# Patient Record
Sex: Male | Born: 1960 | Race: Black or African American | Hispanic: No | Smoking: Current every day smoker
Health system: Southern US, Community
[De-identification: ages and names within clinical notes are randomized; demographics above are authoritative.]

## PROBLEM LIST (undated history)

## (undated) DIAGNOSIS — K219 Gastro-esophageal reflux disease without esophagitis: Secondary | ICD-10-CM

## (undated) DIAGNOSIS — N50812 Left testicular pain: Secondary | ICD-10-CM

## (undated) DIAGNOSIS — R972 Elevated prostate specific antigen [PSA]: Secondary | ICD-10-CM

## (undated) DIAGNOSIS — Z720 Tobacco use: Secondary | ICD-10-CM

## (undated) DIAGNOSIS — Z87448 Personal history of other diseases of urinary system: Secondary | ICD-10-CM

## (undated) DIAGNOSIS — N400 Enlarged prostate without lower urinary tract symptoms: Secondary | ICD-10-CM

## (undated) DIAGNOSIS — R3129 Other microscopic hematuria: Secondary | ICD-10-CM

## (undated) DIAGNOSIS — Z87442 Personal history of urinary calculi: Secondary | ICD-10-CM

## (undated) DIAGNOSIS — N451 Epididymitis: Secondary | ICD-10-CM

## (undated) HISTORY — DX: Epididymitis: N45.1

## (undated) HISTORY — PX: COLONOSCOPY: SHX174

## (undated) HISTORY — DX: Other microscopic hematuria: R31.29

## (undated) HISTORY — DX: Tobacco use: Z72.0

## (undated) HISTORY — DX: Left testicular pain: N50.812

## (undated) HISTORY — DX: Personal history of other diseases of urinary system: Z87.448

## (undated) HISTORY — DX: Benign prostatic hyperplasia without lower urinary tract symptoms: N40.0

## (undated) HISTORY — DX: Elevated prostate specific antigen (PSA): R97.20

## (undated) HISTORY — PX: NO PAST SURGERIES: SHX2092

---

## 2010-02-11 ENCOUNTER — Ambulatory Visit: Payer: Self-pay | Admitting: Family Medicine

## 2010-07-08 ENCOUNTER — Ambulatory Visit: Payer: Self-pay | Admitting: Family Medicine

## 2011-12-08 ENCOUNTER — Emergency Department: Payer: Self-pay | Admitting: Emergency Medicine

## 2013-08-06 ENCOUNTER — Emergency Department: Payer: Self-pay

## 2013-08-06 LAB — COMPREHENSIVE METABOLIC PANEL
ALK PHOS: 78 U/L
ALT: 24 U/L (ref 12–78)
AST: 17 U/L (ref 15–37)
Albumin: 3.3 g/dL — ABNORMAL LOW (ref 3.4–5.0)
Anion Gap: 8 (ref 7–16)
BUN: 14 mg/dL (ref 7–18)
Bilirubin,Total: 0.6 mg/dL (ref 0.2–1.0)
CREATININE: 0.88 mg/dL (ref 0.60–1.30)
Calcium, Total: 9.3 mg/dL (ref 8.5–10.1)
Chloride: 99 mmol/L (ref 98–107)
Co2: 27 mmol/L (ref 21–32)
EGFR (Non-African Amer.): >60
Glucose: 119 mg/dL — ABNORMAL HIGH (ref 65–99)
OSMOLALITY: 270 (ref 275–301)
POTASSIUM: 3.7 mmol/L (ref 3.5–5.1)
Sodium: 134 mmol/L — ABNORMAL LOW (ref 136–145)
Total Protein: 7.5 g/dL (ref 6.4–8.2)

## 2013-08-06 LAB — URINALYSIS, COMPLETE
BILIRUBIN, UR: NEGATIVE
Nitrite: NEGATIVE
PH: 5 (ref 4.5–8.0)
Protein: 100
RBC,UR: 8 /HPF (ref 0–5)
SPECIFIC GRAVITY: 1.029 (ref 1.003–1.030)

## 2013-08-06 LAB — CBC WITH DIFFERENTIAL/PLATELET
BASOS PCT: 0.1 %
Basophil #: 0 10*3/uL (ref 0.0–0.1)
Eosinophil #: 0 10*3/uL (ref 0.0–0.7)
Eosinophil %: 0 %
HCT: 41.8 % (ref 40.0–52.0)
HGB: 14 g/dL (ref 13.0–18.0)
LYMPHS PCT: 3.3 %
Lymphocyte #: 0.8 10*3/uL — ABNORMAL LOW (ref 1.0–3.6)
MCH: 30.1 pg (ref 26.0–34.0)
MCHC: 33.4 g/dL (ref 32.0–36.0)
MCV: 90 fL (ref 80–100)
MONOS PCT: 6.8 %
Monocyte #: 1.5 x10 3/mm — ABNORMAL HIGH (ref 0.2–1.0)
NEUTROS PCT: 89.8 %
Neutrophil #: 20.1 10*3/uL — ABNORMAL HIGH (ref 1.4–6.5)
Platelet: 203 10*3/uL (ref 150–440)
RBC: 4.64 10*6/uL (ref 4.40–5.90)
RDW: 14.1 % (ref 11.5–14.5)
WBC: 22.5 10*3/uL — AB (ref 3.8–10.6)

## 2013-08-06 LAB — GC/CHLAMYDIA PROBE AMP

## 2013-10-30 ENCOUNTER — Ambulatory Visit: Payer: Self-pay | Admitting: Gastroenterology

## 2014-10-01 ENCOUNTER — Encounter: Payer: Self-pay | Admitting: *Deleted

## 2014-10-02 ENCOUNTER — Ambulatory Visit: Payer: Self-pay | Admitting: Urology

## 2014-10-02 ENCOUNTER — Encounter: Payer: Self-pay | Admitting: *Deleted

## 2015-07-16 ENCOUNTER — Encounter: Payer: Self-pay | Admitting: Emergency Medicine

## 2015-07-16 ENCOUNTER — Emergency Department: Payer: 59

## 2015-07-16 ENCOUNTER — Emergency Department
Admission: EM | Admit: 2015-07-16 | Discharge: 2015-07-16 | Disposition: A | Discharge status: Home / Self Care | Payer: 59 | Attending: Emergency Medicine | Admitting: Emergency Medicine

## 2015-07-16 DIAGNOSIS — N50811 Right testicular pain: Secondary | ICD-10-CM | POA: Diagnosis present

## 2015-07-16 DIAGNOSIS — F17200 Nicotine dependence, unspecified, uncomplicated: Secondary | ICD-10-CM | POA: Diagnosis not present

## 2015-07-16 DIAGNOSIS — N451 Epididymitis: Secondary | ICD-10-CM | POA: Diagnosis not present

## 2015-07-16 LAB — CBC
HCT: 41.6 % (ref 40.0–52.0)
Hemoglobin: 14 g/dL (ref 13.0–18.0)
MCH: 29.3 pg (ref 26.0–34.0)
MCHC: 33.6 g/dL (ref 32.0–36.0)
MCV: 87.3 fL (ref 80.0–100.0)
PLATELETS: 230 10*3/uL (ref 150–440)
RBC: 4.76 MIL/uL (ref 4.40–5.90)
RDW: 14.9 % — AB (ref 11.5–14.5)
WBC: 14.1 10*3/uL — AB (ref 3.8–10.6)

## 2015-07-16 LAB — BASIC METABOLIC PANEL
ANION GAP: 12 (ref 5–15)
BUN: 18 mg/dL (ref 6–20)
CALCIUM: 9.2 mg/dL (ref 8.9–10.3)
CO2: 25 mmol/L (ref 22–32)
CREATININE: 0.94 mg/dL (ref 0.61–1.24)
Chloride: 102 mmol/L (ref 101–111)
GFR calc Af Amer: >60 mL/min (ref >60)
GLUCOSE: 115 mg/dL — AB (ref 65–99)
Potassium: 4.1 mmol/L (ref 3.5–5.1)
Sodium: 139 mmol/L (ref 135–145)

## 2015-07-16 MED ORDER — OXYCODONE-ACETAMINOPHEN 7.5-325 MG PO TABS: 1.0000 | ORAL_TABLET | ORAL | Status: DC | PRN

## 2015-07-16 MED ORDER — LEVOFLOXACIN 750 MG PO TABS
750.0000 mg | ORAL_TABLET | Freq: Every day | ORAL | Status: AC
Start: 2015-07-16 — End: 2015-07-23

## 2015-07-16 MED ORDER — HYDROMORPHONE HCL 1 MG/ML IJ SOLN
0.5000 mg | Freq: Once | INTRAMUSCULAR | Status: AC
Start: 2015-07-16 — End: 2015-07-16
  Administered 2015-07-16: 0.5 mg via INTRAVENOUS
  Filled 2015-07-16: qty 1

## 2015-07-16 MED ORDER — IBUPROFEN 800 MG PO TABS: 800.0000 mg | ORAL_TABLET | Freq: Three times a day (TID) | ORAL | Status: DC | PRN

## 2015-07-16 NOTE — ED Notes (Signed)
Pt transported to ultrasound.

## 2015-07-16 NOTE — ED Provider Notes (Signed)
[facility] Emergency Department Provider Note  ____________________________________________  Time seen: Approximately 10:00 AM  I have reviewed the triage vital signs and the nursing notes.   HISTORY  Chief Complaint Groin Pain    HPI Joel Clay is a 55 y.o. male with a history of epididymitis presenting with right testicular and scrotal pain.The patient reports that he was driving home from [place] last night when he developed right-sided testicular pain. He denies any penile discharge, lesions, sexual activity. No fever, nausea vomiting or diarrhea. Patient reports "this is happening before but they never found anything wrong." Reviewing his to previous ultrasound images, the patient has had findings consistent with epididymitis, as well as varicocele in the past.   Past Medical History  Diagnosis Date  . Hematuria, microscopic   . Tobacco abuse   . Benign enlargement of prostate   . Elevated PSA   . Epididymitis   . Testicular pain, left     There are no active problems to display for this patient.   History reviewed. No pertinent past surgical history.  No current outpatient prescriptions on file.  Allergies Review of patient's allergies indicates no known allergies.  Family History  Problem Relation Age of Onset  . Renal Disease Maternal Grandmother     Social History Social History  Substance Use Topics  . Smoking status: Current Every Day Smoker  . Smokeless tobacco: None  . Alcohol Use: 0.0 oz/week    0 Standard drinks or equivalent per week     Comment: occasional    Review of Systems Constitutional: No fever/chills. Eyes: No visual changes. ENT: No sore throat. No congestion or rhinorrhea. Cardiovascular: Denies chest pain. Denies palpitations. Respiratory: Denies shortness of breath.  No cough. Gastrointestinal: No abdominal pain.  No nausea, no vomiting.  No diarrhea.  No constipation. Genitourinary: Negative  for dysuria.Positive for right testicular and scrotal pain. No penile discharge or lesions. Musculoskeletal: Negative for back pain. Skin: Negative for rash. Neurological: Negative for headaches. No focal numbness, tingling or weakness.   10-point ROS otherwise negative.  ____________________________________________   PHYSICAL EXAM:  VITAL SIGNS: ED Triage Vitals  Enc Vitals Group     BP 07/16/15 0940 141/85 mmHg     Pulse Rate 07/16/15 0940 78     Resp 07/16/15 0940 18     Temp 07/16/15 0940 99 F (37.2 C)     Temp Source 07/16/15 0940 Oral     SpO2 07/16/15 0940 98 %     Weight 07/16/15 0940 165 lb (74.844 kg)     Height 07/16/15 0940 5\' 7"  (1.702 m)     Head Cir --      Peak Flow --      Pain Score 07/16/15 0940 10     Pain Loc --      Pain Edu? --      Excl. in [place]? --     Constitutional: Alert and oriented. Well appearing and in no acute distress. Answers questions appropriately. Eyes: Conjunctivae are normal.  EOMI. No scleral icterus. Head: Atraumatic. Nose: No congestion/rhinnorhea. Mouth/Throat: Mucous membranes are moist.  Neck: No stridor.  Supple.   Cardiovascular: Normal rate.  Respiratory: Normal respiratory effort.   Gastrointestinal: Soft, nontender and nondistended.  No guarding or rebound.  No peritoneal signs. Genitourinary: Normal-appearing penis without lesions or discharge. Left testicle has a normal lie and is without mass or pain. No left inguinal hernia. Right testicle is tender to palpation with some posterior swelling. The  patient does not tolerate my exam well, so I am unable to get a full palpation or an inguinal exam. There is no skin change over the scrotum, nor any erythema or warmth. Musculoskeletal: No LE edema. No ttp in the calves or palpable cords.  Negative Homan's sign. Neurologic:  A&Ox3.  Speech is clear.  Face and smile are symmetric.  EOMI.  Moves all extremities well. Skin:  Skin is warm, dry and intact. No rash  noted. Psychiatric: Mood and affect are normal. Speech and behavior are normal.  Normal judgement.  ____________________________________________   LABS (all labs ordered are listed, but only abnormal results are displayed)  Labs Reviewed  CBC - Abnormal; Notable for the following:    WBC 14.1 (*)    RDW 14.9 (*)    All other components within normal limits  BASIC METABOLIC PANEL - Abnormal; Notable for the following:    Glucose, Bld 115 (*)    All other components within normal limits  URINALYSIS COMPLETEWITH MICROSCOPIC ([facility] ONLY)   ____________________________________________  EKG  Not indicated  ____________________________________________  RADIOLOGY  US Scrotum  07/16/2015  CLINICAL DATA:  Right testicular pain. EXAM: SCROTAL ULTRASOUND DOPPLER ULTRASOUND OF THE TESTICLES TECHNIQUE: Complete ultrasound examination of the testicles, epididymis, and other scrotal structures was performed. Color and spectral Doppler ultrasound were also utilized to evaluate blood flow to the testicles. COMPARISON:  Ultrasound dated 08/06/2013 FINDINGS: Right testicle Measurements: 4.2 x 2.3 x 2.8 cm. No mass or microlithiasis visualized. Left testicle Measurements: 4.3 x 1.6 x 2.1 cm decreased in size from 4.6 x 2.5 x 3.0 cm 1 year ago. The testicle is now diffusely more hypoechoic than on the prior study and more hypoechoic than the right testicle to a greater degree than on the prior study. Right epididymis: The right epididymis is enlarged and heterogeneous and hypervascular consistent with acute right epididymitis, new since the prior study. Left epididymis: Normal. The epididymitis seen on the prior study has resolved. Hydrocele:  Small bilaterally. Varicocele:  Bilateral. Pulsed Doppler interrogation of both testes demonstrates normal low resistance arterial and venous waveforms bilaterally. IMPRESSION: 1. Acute right epididymitis. 2. The patient has developed atrophy and decreased perfusion of  the left testicle since the prior study consistent with prior orchitis, probably associated with the prior left epididymitis. Electronically Signed   By: Lorriane Shire M.D.   On: 07/16/2015 11:24   [place] Art/ven Flow Abd Pelv Doppler  07/16/2015  CLINICAL DATA:  Right testicular pain. EXAM: SCROTAL ULTRASOUND DOPPLER ULTRASOUND OF THE TESTICLES TECHNIQUE: Complete ultrasound examination of the testicles, epididymis, and other scrotal structures was performed. Color and spectral Doppler ultrasound were also utilized to evaluate blood flow to the testicles. COMPARISON:  Ultrasound dated 08/06/2013 FINDINGS: Right testicle Measurements: 4.2 x 2.3 x 2.8 cm. No mass or microlithiasis visualized. Left testicle Measurements: 4.3 x 1.6 x 2.1 cm decreased in size from 4.6 x 2.5 x 3.0 cm 1 year ago. The testicle is now diffusely more hypoechoic than on the prior study and more hypoechoic than the right testicle to a greater degree than on the prior study. Right epididymis: The right epididymis is enlarged and heterogeneous and hypervascular consistent with acute right epididymitis, new since the prior study. Left epididymis: Normal. The epididymitis seen on the prior study has resolved. Hydrocele:  Small bilaterally. Varicocele:  Bilateral. Pulsed Doppler interrogation of both testes demonstrates normal low resistance arterial and venous waveforms bilaterally. IMPRESSION: 1. Acute right epididymitis. 2. The patient has developed atrophy and  decreased perfusion of the left testicle since the prior study consistent with prior orchitis, probably associated with the prior left epididymitis. Electronically Signed   By: Lorriane Shire M.D.   On: 07/16/2015 11:24    ____________________________________________   PROCEDURES  Procedure(s) performed: None  Critical Care performed: No ____________________________________________   INITIAL IMPRESSION / ASSESSMENT AND PLAN / ED COURSE  Pertinent labs & imaging results that  were available during my care of the patient were reviewed by me and considered in my medical decision making (see chart for details).  55 y.o. male with right-sided scrotal and testicular pain since last night. We'll get an ultrasound to evaluate for varicocele, hydrocele, or torsion although this is less likely. He may also have epididymitis.  ----------------------------------------- 12:03 PM on 07/16/2015 -----------------------------------------  The patient has findings consistent with epididymitis on the right on his ultrasound. This clinically correlates as well. The patient denies any recent sexual activity over the past months, so we'll treat him for Escherichia coli, and a much less suspicious of gonorrhea or chlamydia. He will go home with a prescription for Levaquin, and instructions to follow up with urology. ____________________________________________  FINAL CLINICAL IMPRESSION(S) / ED DIAGNOSES  Final diagnoses:  Right testicular pain  Epididymitis, right      NEW MEDICATIONS STARTED DURING THIS VISIT:  New Prescriptions   No medications on file     Eula Listen, MD 07/16/15 1204

## 2015-07-16 NOTE — Discharge Instructions (Signed)
Please take the entire course of antibiotics, even if you are feeling better.  Please make a follow up appointment with your Urologist, or the Urologist from [facility].    For mild to moderate pain, you may ice your right testicle and take Motrin.  For severe pain, you may take Percocet.  Do not drive within 8 hours of taking Percocet.  Return to the emergency department for severe pain, fever, inability to keep down fluids, or any other symptoms concerning to you.  Epididymitis Epididymitis is swelling (inflammation) of the epididymis. The epididymis is a cord-like structure that is located along the top and back part of the testicle. It collects and stores sperm from the testicle. This condition can also cause pain and swelling of the testicle and scrotum. Symptoms usually start suddenly (acute epididymitis). Sometimes epididymitis starts gradually and lasts for a while (chronic epididymitis). This type may be harder to treat. CAUSES In men 55 and younger, this condition is usually caused by a bacterial infection or sexually transmitted disease (STD), such as:  Gonorrhea.  Chlamydia.  In men 73 and older who do not have anal sex, this condition is usually caused by bacteria from a blockage or abnormalities in the urinary system. These can result from:  Having a tube placed into the bladder (urinary catheter).  Having an enlarged or inflamed prostate gland.  Having recent urinary tract surgery. In men who have a condition that weakens the body's defense system (immune system), such as HIV, this condition can be caused by:   Other bacteria, including tuberculosis and syphilis.  Viruses.  Fungi. Sometimes this condition occurs without infection. That may happen if urine flows backward into the epididymis after heavy lifting or straining. RISK FACTORS This condition is more likely to develop in men:  Who have unprotected sex with more than one partner.  Who  have anal sex.   Who have recently had surgery.   Who have a urinary catheter.  Who have urinary problems.  Who have a suppressed immune system. SYMPTOMS  This condition usually begins suddenly with chills, fever, and pain behind the scrotum and in the testicle. Other symptoms include:   Swelling of the scrotum, testicle, or both.  Pain whenejaculatingor urinating.  Pain in the back or belly.  Nausea.  Itching and discharge from the penis.  Frequent need to pass urine.  Redness and tenderness of the scrotum. DIAGNOSIS Your health care provider can diagnose this condition based on your symptoms and medical history. Your health care provider will also do a physical exam to ask about your symptoms and check your scrotum and testicle for swelling, pain, and redness. You may also have other tests, including:   Examination of discharge from the penis.  Urine tests for infections, such as STDs.  Your health care provider may test you for other STDs, including HIV. TREATMENT Treatment for this condition depends on the cause. If your condition is caused by a bacterial infection, oral antibiotic medicine may be prescribed. If the bacterial infection has spread to your blood, you may need to receive IV antibiotics. Nonbacterial epididymitis is treated with home care that includes bed rest and elevation of the scrotum. Surgery may be needed to treat:  Bacterial epididymitis that causes pus to build up in the scrotum (abscess).  Chronic epididymitis that has not responded to other treatments. HOME CARE INSTRUCTIONS Medicines  Take over-the-counter and prescription medicines only as told by your health care provider.   If you were  prescribed an antibiotic medicine, take it as told by your health care provider. Do not stop taking the antibiotic even if your condition improves. Sexual Activity  If your epididymitis was caused by an STD, avoid sexual activity until your  treatment is complete.  Inform your sexual partner or partners if you test positive for an STD. They may need to be treated.Do not engage in sexual activity with your partner or partners until their treatment is completed. General Instructions  Return to your normal activities as told by your health care provider. Ask your health care provider what activities are safe for you.  Keep your scrotum elevated and supported while resting. Ask your health care provider if you should wear a scrotal support, such as a jockstrap. Wear it as told by your health care provider.  If directed, apply ice to the affected area:   Put ice in a plastic bag.  Place a towel between your skin and the bag.  Leave the ice on for 20 minutes, 2-3 times per day.  Try taking a sitz bath to help with discomfort. This is a warm water bath that is taken while you are sitting down. The water should only come up to your hips and should cover your buttocks. Do this 3-4 times per day or as told by your health care provider.  Keep all follow-up visits as told by your health care provider. This is important. SEEK MEDICAL CARE IF:   You have a fever.   Your pain medicine is not helping.   Your pain is getting worse.   Your symptoms do not improve within three days.   This information is not intended to replace advice given to you by your health care provider. Make sure you discuss any questions you have with your health care provider.   Document Released: 02/25/2000 Document Revised: 11/18/2014 Document Reviewed: 07/15/2014 Elsevier Interactive Patient Education [employer].

## 2015-07-16 NOTE — ED Notes (Signed)
Patient to ER for groin/scrotal swelling and pain.

## 2015-12-23 ENCOUNTER — Encounter: Payer: Self-pay | Admitting: Family Medicine

## 2015-12-23 ENCOUNTER — Ambulatory Visit (INDEPENDENT_AMBULATORY_CARE_PROVIDER_SITE_OTHER): Payer: 59 | Admitting: Family Medicine

## 2015-12-23 DIAGNOSIS — M542 Cervicalgia: Secondary | ICD-10-CM | POA: Diagnosis not present

## 2015-12-23 DIAGNOSIS — G8929 Other chronic pain: Secondary | ICD-10-CM | POA: Insufficient documentation

## 2015-12-23 MED ORDER — TIZANIDINE HCL 4 MG PO CAPS: 4.0000 mg | ORAL_CAPSULE | Freq: Every evening | ORAL | 0 refills | Status: DC | PRN

## 2015-12-23 NOTE — Progress Notes (Signed)
Name: Joel Clay   MRN: [id number]    DOB: 26-Mar-1960   Date:12/23/2015       Progress Note  Subjective  Chief Complaint  Chief Complaint  Patient presents with  . Neck Pain  This patient is usually followed by another provider, new to me  Neck Pain   This is a chronic problem. The current episode started more than 1 month ago (2 months ago, started tingling 2 weeks ago). The problem occurs intermittently. The pain is present in the right side. The pain is at a severity of 5/10. Exacerbated by: lying down at night and resting his head on the pillow. Stiffness is present at night. Associated symptoms include numbness. Pertinent negatives include no fever, headaches, paresis or weakness. He has tried nothing for the symptoms.     Past Medical History:  Diagnosis Date  . Benign enlargement of prostate   . Elevated PSA   . Epididymitis   . Hematuria, microscopic   . Testicular pain, left   . Tobacco abuse     History reviewed. No pertinent surgical history.  Family History  Problem Relation Age of Onset  . Renal Disease Maternal Grandmother     Social History   Social History  . Marital status: Married    Spouse name: N/A  . Number of children: N/A  . Years of education: N/A   Occupational History  . Not on file.   Social History Main Topics  . Smoking status: Current Every Day Smoker    Packs/day: 0.50    Types: Cigarettes  . Smokeless tobacco: Never Used  . Alcohol use 0.0 oz/week     Comment: occasional  . Drug use: No  . Sexual activity: Yes   Other Topics Concern  . Not on file   Social History Narrative  . No narrative on file     Current Outpatient Prescriptions:  .  ibuprofen (ADVIL,MOTRIN) 800 MG tablet, Take 1 tablet (800 mg total) by mouth every 8 (eight) hours as needed. (Patient not taking: Reported on 12/23/2015), Disp: 20 tablet, Rfl: 0 .  oxyCODONE-acetaminophen (PERCOCET) 7.5-325 MG tablet, Take 1 tablet by mouth every 4 (four) hours as  needed for severe pain. (Patient not taking: Reported on 12/23/2015), Disp: 10 tablet, Rfl: 0  No Known Allergies   Review of Systems  Constitutional: Negative for fever.  Musculoskeletal: Positive for back pain, joint pain and neck pain.  Neurological: Positive for numbness. Negative for weakness and headaches.    Objective  Vitals:   12/23/15 1356  BP: 108/78  Pulse: 82  Resp: 16  Temp: 98.9 F (37.2 C)  TempSrc: Oral  SpO2: 98%  Weight: 156 lb 9.6 oz (71 kg)  Height: 5\' 7"  (1.702 m)    Physical Exam  Constitutional: He is well-developed, well-nourished, and in no distress.  HENT:  Head: Normocephalic and atraumatic.  Musculoskeletal:       Right shoulder: He exhibits normal range of motion, no tenderness and no pain.       Cervical back: He exhibits normal range of motion, no tenderness, no pain and no spasm.  Psychiatric: Mood, memory, affect and judgment normal.  Nursing note and vitals reviewed.   Assessment & Plan  1. Musculoskeletal neck pain Likely muscle spasm based on history and location of pain. We'll start on tizanidine 4 mg at bedtime as needed. Obtain x-ray of cervical spine to rule out any acute spinal abnormalities. - tiZANidine (ZANAFLEX) 4 MG capsule; Take 1 capsule (4  mg total) by mouth at bedtime as needed for muscle spasms.  Dispense: 15 capsule; Refill: 0 - DG Cervical Spine Complete; Future   Harolyn Cocker [name] A. [facility] 12/23/2015 2:06 PM

## 2016-01-14 ENCOUNTER — Ambulatory Visit: Payer: 59 | Admitting: Family Medicine

## 2016-01-21 ENCOUNTER — Ambulatory Visit
Admission: RE | Admit: 2016-01-21 | Discharge: 2016-01-21 | Disposition: A | Discharge status: Home / Self Care | Payer: 59 | Source: Ambulatory Visit | Attending: Family Medicine | Admitting: Family Medicine

## 2016-01-21 ENCOUNTER — Encounter: Payer: Self-pay | Admitting: Family Medicine

## 2016-01-21 ENCOUNTER — Ambulatory Visit (INDEPENDENT_AMBULATORY_CARE_PROVIDER_SITE_OTHER): Payer: 59 | Admitting: Family Medicine

## 2016-01-21 VITALS — BP 124/68 | HR 66 | Temp 98.1°F | Resp 16 | Ht 67.0 in | Wt 160.8 lb

## 2016-01-21 DIAGNOSIS — M542 Cervicalgia: Secondary | ICD-10-CM | POA: Insufficient documentation

## 2016-01-21 DIAGNOSIS — G8929 Other chronic pain: Secondary | ICD-10-CM

## 2016-01-21 MED ORDER — NAPROXEN 500 MG PO TABS: 500.0000 mg | ORAL_TABLET | Freq: Two times a day (BID) | ORAL | 0 refills | Status: DC

## 2016-01-21 NOTE — Progress Notes (Signed)
Name: Joel Clay   MRN: [id number]    DOB: June 15, 1960   Date:01/21/2016       Progress Note  Subjective  Chief Complaint  Chief Complaint  Patient presents with  . Follow-up    neck pain    Neck Pain   This is a chronic problem. Episode onset: present for long time, not sure how long. The problem occurs intermittently. The pain is associated with nothing. The pain is present in the right side (tingling on the right side of neck). The quality of the pain is described as shooting. The pain is at a severity of 5/10. The symptoms are aggravated by bending and twisting (cannot turn his neck completely to left and right side). Pertinent negatives include no chest pain, fever, headaches, paresis or weakness. He has tried muscle relaxants for the symptoms. The treatment provided moderate relief.    Past Medical History:  Diagnosis Date  . Benign enlargement of prostate   . Elevated PSA   . Epididymitis   . Hematuria, microscopic   . Testicular pain, left   . Tobacco abuse     No past surgical history on file.  Family History  Problem Relation Age of Onset  . Renal Disease Maternal Grandmother     Social History   Social History  . Marital status: Married    Spouse name: N/A  . Number of children: N/A  . Years of education: N/A   Occupational History  . Not on file.   Social History Main Topics  . Smoking status: Current Every Day Smoker    Packs/day: 0.50    Types: Cigarettes  . Smokeless tobacco: Never Used  . Alcohol use 0.0 oz/week     Comment: occasional  . Drug use: No  . Sexual activity: Yes   Other Topics Concern  . Not on file   Social History Narrative  . No narrative on file     Current Outpatient Prescriptions:  .  tiZANidine (ZANAFLEX) 4 MG capsule, Take 1 capsule (4 mg total) by mouth at bedtime as needed for muscle spasms., Disp: 15 capsule, Rfl: 0  No Known Allergies   Review of Systems  Constitutional: Negative for fever.    Cardiovascular: Negative for chest pain.  Musculoskeletal: Positive for neck pain.  Neurological: Negative for weakness and headaches.    Objective  Vitals:   01/21/16 0932  BP: 124/68  Pulse: 66  Resp: 16  Temp: 98.1 F (36.7 C)  TempSrc: Oral  SpO2: 98%  Weight: 160 lb 12.8 oz (72.9 kg)  Height: 5\' 7"  (1.702 m)    Physical Exam  Constitutional: He is well-developed, well-nourished, and in no distress.  Musculoskeletal:       Cervical back: He exhibits tenderness, pain and spasm.       Back:  Nursing note and vitals reviewed.     Assessment & Plan  1. Neck pain, chronic Change to NSAID, muscle relaxant was making him fall asleep. Obtain x-ray of cervical spine for evaluation - naproxen (NAPROSYN) 500 MG tablet; Take 1 tablet (500 mg total) by mouth 2 (two) times daily with a meal.  Dispense: 30 tablet; Refill: 0   Joel Clay [name] A. [facility] 01/21/2016 9:50 AM

## 2016-03-10 ENCOUNTER — Ambulatory Visit (INDEPENDENT_AMBULATORY_CARE_PROVIDER_SITE_OTHER): Payer: 59 | Admitting: Family Medicine

## 2016-03-10 ENCOUNTER — Encounter: Payer: Self-pay | Admitting: Family Medicine

## 2016-03-10 DIAGNOSIS — Z0000 Encounter for general adult medical examination without abnormal findings: Secondary | ICD-10-CM | POA: Diagnosis not present

## 2016-03-10 DIAGNOSIS — R739 Hyperglycemia, unspecified: Secondary | ICD-10-CM | POA: Diagnosis not present

## 2016-03-10 LAB — LIPID PANEL
CHOL/HDL RATIO: 3.1 ratio (ref <5.0)
CHOLESTEROL: 197 mg/dL (ref <200)
HDL: 63 mg/dL (ref >40)
LDL Cholesterol: 110 mg/dL — ABNORMAL HIGH (ref <100)
Triglycerides: 122 mg/dL (ref <150)
VLDL: 24 mg/dL (ref <30)

## 2016-03-10 LAB — POCT GLYCOSYLATED HEMOGLOBIN (HGB A1C): Hemoglobin A1C: 5.6

## 2016-03-10 LAB — CBC WITH DIFFERENTIAL/PLATELET
BASOS PCT: 1 %
Basophils Absolute: 64 cells/uL (ref 0–200)
EOS ABS: 192 {cells}/uL (ref 15–500)
Eosinophils Relative: 3 %
HEMATOCRIT: 44.1 % (ref 38.5–50.0)
HEMOGLOBIN: 14.4 g/dL (ref 13.2–17.1)
LYMPHS ABS: 1984 {cells}/uL (ref 850–3900)
LYMPHS PCT: 31 %
MCH: 29.6 pg (ref 27.0–33.0)
MCHC: 32.7 g/dL (ref 32.0–36.0)
MCV: 90.6 fL (ref 80.0–100.0)
MONO ABS: 512 {cells}/uL (ref 200–950)
MPV: 10.1 fL (ref 7.5–12.5)
Monocytes Relative: 8 %
Neutro Abs: 3648 cells/uL (ref 1500–7800)
Neutrophils Relative %: 57 %
Platelets: 441 10*3/uL — ABNORMAL HIGH (ref 140–400)
RBC: 4.87 MIL/uL (ref 4.20–5.80)
RDW: 14.1 % (ref 11.0–15.0)
WBC: 6.4 10*3/uL (ref 3.8–10.8)

## 2016-03-10 LAB — COMPLETE METABOLIC PANEL WITH GFR
ALBUMIN: 4.1 g/dL (ref 3.6–5.1)
ALT: 27 U/L (ref 9–46)
AST: 23 U/L (ref 10–35)
Alkaline Phosphatase: 57 U/L (ref 40–115)
BILIRUBIN TOTAL: 0.4 mg/dL (ref 0.2–1.2)
BUN: 19 mg/dL (ref 7–25)
CO2: 25 mmol/L (ref 20–31)
CREATININE: 1.04 mg/dL (ref 0.70–1.33)
Calcium: 9.7 mg/dL (ref 8.6–10.3)
Chloride: 103 mmol/L (ref 98–110)
GFR, Est African American: >89 mL/min (ref >=60)
GFR, Est Non African American: 82 mL/min (ref >=60)
Glucose, Bld: 107 mg/dL — ABNORMAL HIGH (ref 65–99)
Potassium: 4.9 mmol/L (ref 3.5–5.3)
Sodium: 140 mmol/L (ref 135–146)
TOTAL PROTEIN: 6.9 g/dL (ref 6.1–8.1)

## 2016-03-10 LAB — TSH: TSH: 0.84 m[IU]/L (ref 0.40–4.50)

## 2016-03-10 NOTE — Progress Notes (Signed)
Name: Joel Clay   MRN: [id number]    DOB: 08/01/1960   Date:03/10/2016       Progress Note  Subjective  Chief Complaint  Chief Complaint  Patient presents with  . Annual Exam    HPI  Pt. Presents for complete physical exam. He had colonoscopy 2 years ago, was normal.  Prostate exam was done last year during physical.   Past Medical History:  Diagnosis Date  . Benign enlargement of prostate   . Elevated PSA   . Epididymitis   . Hematuria, microscopic   . Testicular pain, left   . Tobacco abuse     No past surgical history on file.  Family History  Problem Relation Age of Onset  . Renal Disease Maternal Grandmother     Social History   Social History  . Marital status: Married    Spouse name: N/A  . Number of children: N/A  . Years of education: N/A   Occupational History  . Not on file.   Social History Main Topics  . Smoking status: Current Every Day Smoker    Packs/day: 0.50    Types: Cigarettes  . Smokeless tobacco: Never Used  . Alcohol use 0.0 oz/week     Comment: occasional  . Drug use: No  . Sexual activity: Yes   Other Topics Concern  . Not on file   Social History Narrative  . No narrative on file     Current Outpatient Prescriptions:  .  naproxen (NAPROSYN) 500 MG tablet, Take 1 tablet (500 mg total) by mouth 2 (two) times daily with a meal. (Patient not taking: Reported on 03/10/2016), Disp: 30 tablet, Rfl: 0  No Known Allergies   Review of Systems  Constitutional: Negative for chills, fever and malaise/fatigue.  HENT: Positive for congestion and sinus pain. Negative for ear pain and sore throat.   Eyes: Negative for blurred vision and double vision.  Respiratory: Negative for cough and shortness of breath.   Cardiovascular: Negative for chest pain and leg swelling.  Gastrointestinal: Negative for abdominal pain, blood in stool, constipation, diarrhea, nausea and vomiting.  Genitourinary: Negative for hematuria.   Musculoskeletal: Positive for neck pain. Negative for back pain and joint pain.  Neurological: Negative for headaches.  Psychiatric/Behavioral: Negative for depression. The patient is not nervous/anxious and does not have insomnia.      Objective  Vitals:   03/10/16 0918  BP: 122/68  Pulse: 69  Resp: 16  Temp: 98.1 F (36.7 C)  TempSrc: Oral  SpO2: 98%  Weight: 156 lb 12.8 oz (71.1 kg)  Height: 5\' 7"  (1.702 m)    Physical Exam  Constitutional: He is oriented to person, place, and time and well-developed, well-nourished, and in no distress.  HENT:  Head: Normocephalic and atraumatic.  Cardiovascular: Normal rate, regular rhythm and normal heart sounds.   No murmur heard. Pulmonary/Chest: Effort normal and breath sounds normal. He has no wheezes.  Abdominal: Soft. Bowel sounds are normal. There is no tenderness.  Genitourinary:  Genitourinary Comments: deferred  Musculoskeletal: He exhibits no edema.       Right ankle: He exhibits no swelling.       Left ankle: He exhibits no swelling.  Neurological: He is alert and oriented to person, place, and time.  Skin: Skin is warm, dry and intact.  Psychiatric: Mood, memory, affect and judgment normal.  Nursing note and vitals reviewed.      Assessment & Plan  1. Annual physical exam Obtain age-appropriate laboratory  screenings - Lipid Profile - CBC with Differential - COMPLETE METABOLIC PANEL WITH GFR - PSA - TSH - Vitamin D (25 hydroxy)  2. Hyperglycemia Point of care A1c is 5.6%, considered normal - POCT HgB A1C   Joel Clay [name] A. [facility] 03/10/2016 9:20 AM

## 2016-03-11 LAB — PSA: PSA: 2.6 ng/mL (ref <=4.0)

## 2016-03-11 LAB — VITAMIN D 25 HYDROXY (VIT D DEFICIENCY, FRACTURES): Vit D, 25-Hydroxy: 12 ng/mL — ABNORMAL LOW (ref 30–100)

## 2016-03-22 ENCOUNTER — Telehealth: Payer: Self-pay

## 2016-03-22 MED ORDER — VITAMIN D (ERGOCALCIFEROL) 1.25 MG (50000 UNIT) PO CAPS: 50000.0000 [IU] | ORAL_CAPSULE | ORAL | 0 refills | Status: DC

## 2016-03-22 NOTE — Telephone Encounter (Signed)
Patient has been notified of lab results and a prescription of vitmain D3 50,000 units take 1 capsule once a week for 12 weeks has been sent to [facility] per Dr. Manuella Ghazi, pt has been notified and verbalized understanding

## 2016-05-30 ENCOUNTER — Encounter: Payer: Self-pay | Admitting: Family Medicine

## 2017-02-16 ENCOUNTER — Encounter: Payer: Self-pay | Admitting: Family Medicine

## 2017-02-16 ENCOUNTER — Ambulatory Visit: Payer: 59 | Admitting: Family Medicine

## 2017-02-16 NOTE — Progress Notes (Signed)
Name: Joel Clay   MRN: [id number]    DOB: [birth year]   Date:02/16/2017       Progress Note  Subjective  Chief Complaint  Chief Complaint  Patient presents with  . Follow-up    HPI  This encounter was created in error - please disregard.  Past Medical History:  Diagnosis Date  . Benign enlargement of prostate   . Elevated PSA   . Epididymitis   . Hematuria, microscopic   . Testicular pain, left   . Tobacco abuse     History reviewed. No pertinent surgical history.  Family History  Problem Relation Age of Onset  . Renal Disease Maternal Grandmother     Social History   Socioeconomic History  . Marital status: Married    Spouse name: Not on file  . Number of children: Not on file  . Years of education: Not on file  . Highest education level: Not on file  Social Needs  . Financial resource strain: Not on file  . Food insecurity - worry: Not on file  . Food insecurity - inability: Not on file  . Transportation needs - medical: Not on file  . Transportation needs - non-medical: Not on file  Occupational History  . Not on file  Tobacco Use  . Smoking status: Current Some Day Smoker    Packs/day: 0.50    Types: Cigarettes  . Smokeless tobacco: Never Used  Substance and Sexual Activity  . Alcohol use: Yes    Alcohol/week: 0.0 oz    Comment: occasional  . Drug use: No  . Sexual activity: Yes  Other Topics Concern  . Not on file  Social History Narrative  . Not on file     Current Outpatient Medications:  .  naproxen (NAPROSYN) 500 MG tablet, Take 1 tablet (500 mg total) by mouth 2 (two) times daily with a meal. (Patient not taking: Reported on 03/10/2016), Disp: 30 tablet, Rfl: 0 .  Vitamin D, Ergocalciferol, (DRISDOL) 50000 units CAPS capsule, Take 1 capsule (50,000 Units total) by mouth once a week. For 12 weeks (Patient not taking: Reported on 02/16/2017), Disp: 12 capsule, Rfl: 0  No Known Allergies   ROS    Objective  Vitals:   02/16/17  1056  BP: 110/68  Pulse: 65  Resp: 16  Temp: 98 F (36.7 C)  TempSrc: Oral  SpO2: 98%  Weight: 160 lb 4.8 oz (72.7 kg)  Height: 5\' 7"  (1.702 m)    Physical Exam     No results found for this or any previous visit (from the past 2160 hour(s)).   Assessment & Plan  There are no diagnoses linked to this encounter.  Karlyn Glasco [name] 02/16/2017 11:06 AM

## 2017-03-16 ENCOUNTER — Encounter: Payer: Self-pay | Admitting: Family Medicine

## 2017-03-16 ENCOUNTER — Ambulatory Visit: Payer: 59 | Admitting: Family Medicine

## 2017-03-16 VITALS — BP 118/82 | HR 79 | Temp 98.4°F | Resp 16 | Wt 158.2 lb

## 2017-03-16 DIAGNOSIS — R2231 Localized swelling, mass and lump, right upper limb: Secondary | ICD-10-CM | POA: Diagnosis not present

## 2017-03-16 DIAGNOSIS — J00 Acute nasopharyngitis [common cold]: Secondary | ICD-10-CM | POA: Diagnosis not present

## 2017-03-16 DIAGNOSIS — Z0000 Encounter for general adult medical examination without abnormal findings: Secondary | ICD-10-CM | POA: Diagnosis not present

## 2017-03-16 MED ORDER — FLUTICASONE PROPIONATE 50 MCG/ACT NA SUSP: 2.0000 | Freq: Every day | NASAL | 0 refills | Status: DC

## 2017-03-16 NOTE — Progress Notes (Signed)
Name: Joel Clay   MRN: [id number]    DOB: Jan 27, 1961   Date:03/16/2017       Progress Note  Subjective  Chief Complaint  Chief Complaint  Patient presents with  . Annual Exam    With fasting labs     HPI  Pt. Presents for Complete Physical Exam.  He is due for Colon cancer  Screening in 2025.   Past Medical History:  Diagnosis Date  . Benign enlargement of prostate   . Elevated PSA   . Epididymitis   . Hematuria, microscopic   . Testicular pain, left   . Tobacco abuse     History reviewed. No pertinent surgical history.  Family History  Problem Relation Age of Onset  . Renal Disease Maternal Grandmother     Social History   Socioeconomic History  . Marital status: Married    Spouse name: Not on file  . Number of children: Not on file  . Years of education: Not on file  . Highest education level: Not on file  Social Needs  . Financial resource strain: Not on file  . Food insecurity - worry: Not on file  . Food insecurity - inability: Not on file  . Transportation needs - medical: Not on file  . Transportation needs - non-medical: Not on file  Occupational History  . Not on file  Tobacco Use  . Smoking status: Current Some Day Smoker    Packs/day: 0.50    Types: Cigarettes  . Smokeless tobacco: Never Used  Substance and Sexual Activity  . Alcohol use: Yes    Alcohol/week: 0.0 oz    Comment: occasional  . Drug use: No  . Sexual activity: Yes  Other Topics Concern  . Not on file  Social History Narrative  . Not on file     Current Outpatient Medications:  .  naproxen (NAPROSYN) 500 MG tablet, Take 1 tablet (500 mg total) by mouth 2 (two) times daily with a meal. (Patient not taking: Reported on 03/10/2016), Disp: 30 tablet, Rfl: 0 .  Vitamin D, Ergocalciferol, (DRISDOL) 50000 units CAPS capsule, Take 1 capsule (50,000 Units total) by mouth once a week. For 12 weeks (Patient not taking: Reported on 02/16/2017), Disp: 12 capsule, Rfl: 0  No Known  Allergies   Review of Systems  Constitutional: Negative for chills, fever, malaise/fatigue and weight loss.  HENT: Positive for sinus pain (congestion, sinus pressure). Negative for congestion, ear pain and sore throat.   Eyes: Negative for blurred vision and double vision.  Respiratory: Negative for cough, sputum production and shortness of breath.   Cardiovascular: Negative for chest pain, palpitations and leg swelling.  Gastrointestinal: Negative for abdominal pain, blood in stool, constipation, diarrhea, nausea and vomiting.  Genitourinary: Negative for dysuria and hematuria.  Musculoskeletal: Positive for neck pain. Negative for back pain and joint pain.  Skin: Negative for rash.  Neurological: Negative for dizziness and headaches.  Psychiatric/Behavioral: Negative for depression. The patient is not nervous/anxious and does not have insomnia.      Objective  Vitals:   03/16/17 0954  BP: 118/82  Pulse: 79  Resp: 16  Temp: 98.4 F (36.9 C)  TempSrc: Oral  SpO2: 98%  Weight: 158 lb 3.2 oz (71.8 kg)    Physical Exam  Constitutional: He is oriented to person, place, and time and well-developed, well-nourished, and in no distress.  HENT:  Head: Normocephalic and atraumatic.  Right Ear: Tympanic membrane and ear canal normal. No drainage or swelling.  Left Ear: No drainage or swelling.  Nose: Right sinus exhibits no maxillary sinus tenderness. Left sinus exhibits no maxillary sinus tenderness.  Mouth/Throat: Posterior oropharyngeal erythema present.  Rhinorrhea, mucosal edema and turbinate hypertrophy.  Cardiovascular: Normal rate, regular rhythm and normal heart sounds.  No murmur heard. Pulmonary/Chest: Effort normal and breath sounds normal. He has no wheezes.  Abdominal: Soft. Bowel sounds are normal. There is no tenderness.  Genitourinary:  Genitourinary Comments: deferred  Musculoskeletal: He exhibits no edema.       Right ankle: He exhibits no swelling.        Left ankle: He exhibits no swelling.       Hands: Small, subcutaneous non tender mass on the right hand palmar surface  Neurological: He is alert and oriented to person, place, and time.  Skin: Skin is warm, dry and intact. No rash noted.  Psychiatric: Mood, memory, affect and judgment normal.  Nursing note and vitals reviewed.    Assessment & Plan  1. Annual physical exam Obtain age-appropriate laboratory screening - CBC with Differential/Platelet - COMPLETE METABOLIC PANEL WITH GFR - Lipid panel - TSH - VITAMIN D 25 Hydroxy (Vit-D Deficiency, Fractures) - PSA  2. Mass of right hand Obtain ultrasound of the right hand to evaluate for the cystic mass palpated in the palm - [place] RT UPPER EXTREM LTD SOFT TISSUE NON VASCULAR; Future  3. Acute rhinitis Advised on Flonase to be taken for 5-7 days, still no improvement, we'll consider an antibiotic - fluticasone (FLONASE) 50 MCG/ACT nasal spray; Place 2 sprays into both nostrils daily.  Dispense: 16 g; Refill: 0   Nielle Duford [name] 03/16/2017 10:04 AM

## 2017-03-17 LAB — CBC WITH DIFFERENTIAL/PLATELET
BASOS ABS: 52 {cells}/uL (ref 0–200)
BASOS PCT: 1 %
EOS PCT: 4.1 %
Eosinophils Absolute: 213 cells/uL (ref 15–500)
HEMATOCRIT: 43.6 % (ref 38.5–50.0)
Hemoglobin: 14.2 g/dL (ref 13.2–17.1)
LYMPHS ABS: 1950 {cells}/uL (ref 850–3900)
MCH: 28.9 pg (ref 27.0–33.0)
MCHC: 32.6 g/dL (ref 32.0–36.0)
MCV: 88.6 fL (ref 80.0–100.0)
MPV: 11.2 fL (ref 7.5–12.5)
Monocytes Relative: 7.7 %
Neutro Abs: 2584 cells/uL (ref 1500–7800)
Neutrophils Relative %: 49.7 %
PLATELETS: 310 10*3/uL (ref 140–400)
RBC: 4.92 10*6/uL (ref 4.20–5.80)
RDW: 13.3 % (ref 11.0–15.0)
Total Lymphocyte: 37.5 %
WBC mixed population: 400 cells/uL (ref 200–950)
WBC: 5.2 10*3/uL (ref 3.8–10.8)

## 2017-03-17 LAB — LIPID PANEL
CHOLESTEROL: 217 mg/dL — AB (ref <200)
HDL: 59 mg/dL (ref >40)
LDL Cholesterol (Calc): 129 mg/dL (calc) — ABNORMAL HIGH
Non-HDL Cholesterol (Calc): 158 mg/dL (calc) — ABNORMAL HIGH (ref <130)
Total CHOL/HDL Ratio: 3.7 (calc) (ref <5.0)
Triglycerides: 176 mg/dL — ABNORMAL HIGH (ref <150)

## 2017-03-17 LAB — VITAMIN D 25 HYDROXY (VIT D DEFICIENCY, FRACTURES): Vit D, 25-Hydroxy: 10 ng/mL — ABNORMAL LOW (ref 30–100)

## 2017-03-17 LAB — COMPLETE METABOLIC PANEL WITH GFR
AG RATIO: 1.7 (calc) (ref 1.0–2.5)
ALT: 23 U/L (ref 9–46)
AST: 26 U/L (ref 10–35)
Albumin: 4.5 g/dL (ref 3.6–5.1)
Alkaline phosphatase (APISO): 51 U/L (ref 40–115)
BILIRUBIN TOTAL: 0.4 mg/dL (ref 0.2–1.2)
BUN: 16 mg/dL (ref 7–25)
CHLORIDE: 103 mmol/L (ref 98–110)
CO2: 29 mmol/L (ref 20–32)
Calcium: 10.1 mg/dL (ref 8.6–10.3)
Creat: 1.08 mg/dL (ref 0.70–1.33)
GFR, Est African American: 90 mL/min/{1.73_m2} (ref >=60)
GFR, Est Non African American: 77 mL/min/{1.73_m2} (ref >=60)
Globulin: 2.6 g/dL (calc) (ref 1.9–3.7)
Glucose, Bld: 87 mg/dL (ref 65–99)
POTASSIUM: 4.9 mmol/L (ref 3.5–5.3)
Sodium: 140 mmol/L (ref 135–146)
Total Protein: 7.1 g/dL (ref 6.1–8.1)

## 2017-03-17 LAB — PSA: PSA: 0.9 ng/mL (ref <=4.0)

## 2017-03-17 LAB — TSH: TSH: 0.76 mIU/L (ref 0.40–4.50)

## 2017-03-23 ENCOUNTER — Telehealth: Payer: Self-pay | Admitting: Family Medicine

## 2017-03-23 NOTE — Telephone Encounter (Signed)
Copied from [place] [id number]. Topic: Quick Communication - Lab Results >> Mar 23, 2017  9:09 AM Mart Piggs, CMA wrote: Reach to the patient regarding labs. Message box full not able to leave a message therefore release the labs to nurse pool

## 2017-03-23 NOTE — Telephone Encounter (Signed)
NT was busy at the time someone other than the pt. Had returned call. Let them know that someone would give them a call back about lab results.

## 2018-02-01 ENCOUNTER — Other Ambulatory Visit (HOSPITAL_COMMUNITY)
Admission: RE | Admit: 2018-02-01 | Discharge: 2018-02-01 | Disposition: A | Discharge status: Home / Self Care | Payer: 59 | Source: Ambulatory Visit | Attending: Family Medicine | Admitting: Family Medicine

## 2018-02-01 ENCOUNTER — Encounter: Payer: Self-pay | Admitting: Family Medicine

## 2018-02-01 ENCOUNTER — Ambulatory Visit (INDEPENDENT_AMBULATORY_CARE_PROVIDER_SITE_OTHER): Payer: 59 | Admitting: Family Medicine

## 2018-02-01 ENCOUNTER — Telehealth: Payer: Self-pay | Admitting: *Deleted

## 2018-02-01 VITALS — BP 120/68 | HR 81 | Temp 98.0°F | Resp 18 | Ht 67.0 in | Wt 161.2 lb

## 2018-02-01 DIAGNOSIS — E782 Mixed hyperlipidemia: Secondary | ICD-10-CM

## 2018-02-01 DIAGNOSIS — Z113 Encounter for screening for infections with a predominantly sexual mode of transmission: Secondary | ICD-10-CM

## 2018-02-01 DIAGNOSIS — Z716 Tobacco abuse counseling: Secondary | ICD-10-CM

## 2018-02-01 DIAGNOSIS — Z0000 Encounter for general adult medical examination without abnormal findings: Secondary | ICD-10-CM | POA: Diagnosis not present

## 2018-02-01 DIAGNOSIS — Z122 Encounter for screening for malignant neoplasm of respiratory organs: Secondary | ICD-10-CM

## 2018-02-01 DIAGNOSIS — Z125 Encounter for screening for malignant neoplasm of prostate: Secondary | ICD-10-CM

## 2018-02-01 DIAGNOSIS — R739 Hyperglycemia, unspecified: Secondary | ICD-10-CM | POA: Diagnosis not present

## 2018-02-01 DIAGNOSIS — Z1159 Encounter for screening for other viral diseases: Secondary | ICD-10-CM | POA: Diagnosis not present

## 2018-02-01 DIAGNOSIS — H029 Unspecified disorder of eyelid: Secondary | ICD-10-CM

## 2018-02-01 NOTE — Telephone Encounter (Signed)
Received referral for low dose lung cancer screening CT scan. Attempted to leave message at phone number listed in EMR for patient to call me back to facilitate scheduling scan. However, this option is not available. Will try to contact patient at a later date.

## 2018-02-01 NOTE — Addendum Note (Signed)
Addended by: [name] on: 02/01/2018 01:59 PM   Modules accepted: Orders

## 2018-02-01 NOTE — Patient Instructions (Signed)
Preventive Care 40-64 Years, Male Preventive care refers to lifestyle choices and visits with your health care provider that can promote health and wellness. What does preventive care include?  A yearly physical exam. This is also called an annual well check.  Dental exams once or twice a year.  Routine eye exams. Ask your health care provider how often you should have your eyes checked.  Personal lifestyle choices, including: ? Daily care of your teeth and gums. ? Regular physical activity. ? Eating a healthy diet. ? Avoiding tobacco and drug use. ? Limiting alcohol use. ? Practicing safe sex. ? Taking low-dose aspirin every day starting at age 55. What happens during an annual well check? The services and screenings done by your health care provider during your annual well check will depend on your age, overall health, lifestyle risk factors, and family history of disease. Counseling Your health care provider may ask you questions about your:  Alcohol use.  Tobacco use.  Drug use.  Emotional well-being.  Home and relationship well-being.  Sexual activity.  Eating habits.  Work and work Statistician.  Screening You may have the following tests or measurements:  Height, weight, and BMI.  Blood pressure.  Lipid and cholesterol levels. These may be checked every 5 years, or more frequently if you are over 55 years old.  Skin check.  Lung cancer screening. You may have this screening every year starting at age 55 if you have a 30-pack-year history of smoking and currently smoke or have quit within the past 15 years.  Fecal occult blood test (FOBT) of the stool. You may have this test every year starting at age 55.  Flexible sigmoidoscopy or colonoscopy. You may have a sigmoidoscopy every 5 years or a colonoscopy every 10 years starting at age 55.  Prostate cancer screening. Recommendations will vary depending on your family history and other risks.  Hepatitis C  blood test.  Hepatitis B blood test.  Sexually transmitted disease (STD) testing.  Diabetes screening. This is done by checking your blood sugar (glucose) after you have not eaten for a while (fasting). You may have this done every 1-3 years.  Discuss your test results, treatment options, and if necessary, the need for more tests with your health care provider. Vaccines Your health care provider may recommend certain vaccines, such as:  Influenza vaccine. This is recommended every year.  Tetanus, diphtheria, and acellular pertussis (Tdap, Td) vaccine. You may need a Td booster every 10 years.  Varicella vaccine. You may need this if you have not been vaccinated.  Zoster vaccine. You may need this after age 55.  Measles, mumps, and rubella (MMR) vaccine. You may need at least one dose of MMR if you were born in 1957 or later. You may also need a second dose.  Pneumococcal 13-valent conjugate (PCV13) vaccine. You may need this if you have certain conditions and have not been vaccinated.  Pneumococcal polysaccharide (PPSV23) vaccine. You may need one or two doses if you smoke cigarettes or if you have certain conditions.  Meningococcal vaccine. You may need this if you have certain conditions.  Hepatitis A vaccine. You may need this if you have certain conditions or if you travel or work in places where you may be exposed to hepatitis A.  Hepatitis B vaccine. You may need this if you have certain conditions or if you travel or work in places where you may be exposed to hepatitis B.  Haemophilus influenzae type b (Hib) vaccine.  You may need this if you have certain risk factors.  Talk to your health care provider about which screenings and vaccines you need and how often you need them. This information is not intended to replace advice given to you by your health care provider. Make sure you discuss any questions you have with your health care provider. Document Released: 03/26/2015  Document Revised: 11/17/2015 Document Reviewed: 12/29/2014 Elsevier Interactive Patient Education  [employer].

## 2018-02-01 NOTE — Progress Notes (Signed)
Name: Joel Clay   MRN: [id number]    DOB: [birth year]   Date:02/01/2018       Progress Note  Subjective  Chief Complaint  Chief Complaint  Patient presents with  . Annual Exam    HPI  Patient presents for annual CPE.  USPSTF grade A and B recommendations:  Diet: Eats pasta, salads, beef, chicken. Does not eat a lot of pork.  Does eat some sweets; seldom fried foods.  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  Depression:  Depression screen [facility] 2/9 02/01/2018 03/16/2017 02/16/2017 12/23/2015  Decreased Interest 0 0 0 0  Down, Depressed, Hopeless 0 0 0 0  PHQ - 2 Score 0 0 0 0  Altered sleeping 0 - - -  Tired, decreased energy 0 - - -  Change in appetite 0 - - -  Feeling bad or failure about yourself  0 - - -  Trouble concentrating 0 - - -  Moving slowly or fidgety/restless 0 - - -  Suicidal thoughts 0 - - -  PHQ-9 Score 0 - - -  Difficult doing work/chores Not difficult at all - - -    Hypertension:  BP Readings from Last 3 Encounters:  02/01/18 120/68  03/16/17 118/82  02/16/17 110/68    Obesity: Wt Readings from Last 3 Encounters:  02/01/18 161 lb 3.2 oz (73.1 kg)  03/16/17 158 lb 3.2 oz (71.8 kg)  02/16/17 160 lb 4.8 oz (72.7 kg)   BMI Readings from Last 3 Encounters:  02/01/18 25.25 kg/m  03/16/17 24.78 kg/m  02/16/17 25.11 kg/m    Lipids:   Lab Results  Component Value Date   CHOL 217 (H) 03/16/2017   CHOL 197 03/10/2016   Lab Results  Component Value Date   HDL 59 03/16/2017   HDL 63 03/10/2016   Lab Results  Component Value Date   LDLCALC 129 (H) 03/16/2017   LDLCALC 110 (H) 03/10/2016   Lab Results  Component Value Date   TRIG 176 (H) 03/16/2017   TRIG 122 03/10/2016   Lab Results  Component Value Date   CHOLHDL 3.7 03/16/2017   CHOLHDL 3.1 03/10/2016   No results found for: LDLDIRECT Glucose:  Glucose  Date Value Ref Range Status  08/06/2013 119 (H) 65 - 99  mg/dL Final   Glucose, Bld  Date Value Ref Range Status  03/16/2017 87 65 - 99 mg/dL Final    Comment:    .            Fasting reference interval .   03/10/2016 107 (H) 65 - 99 mg/dL Final  07/16/2015 115 (H) 65 - 99 mg/dL Final      [facility] Visit from 55/22/2019 in [facility]  AUDIT-C Score  1     Divorced; has 3 grown children - all men - 6, 61, 22yo STD testing and prevention (HIV/chl/gon/syphilis): Had new partner this year - we will check STI's today Hep C: We will check today.   Skin cancer: Has some moles under his eyes that he would like removed.  Colorectal cancer: UTD on colonscopy Prostate cancer: No family or personal history.  Lab Results  Component Value Date   PSA 0.9 03/16/2017   PSA 2.6 03/10/2016   IPSS Questionnaire (AUA-7): Over the past month.   1)  How often have you had a sensation of not emptying your bladder completely after you finish urinating?  0 -  Not at all  2)  How often have you had to urinate again less than two hours after you finished urinating? 0 - Not at all  3)  How often have you found you stopped and started again several times when you urinated?  1 - Less than 1 time in 5  4) How difficult have you found it to postpone urination?  0 - Not at all  5) How often have you had a weak urinary stream?  5 - Almost always  6) How often have you had to push or strain to begin urination?  0 - Not at all  7) How many times did you most typically get up to urinate from the time you went to bed until the time you got up in the morning?  0 - None  Total score:  0-7 mildly symptomatic   8-19 moderately symptomatic   20-35 severely symptomatic  Mildly symptomatic - score of 6. We will check PSA today.  Lung cancer:  Still smoking, but down to 3-4 cigarettes a day. Has been smoking since 1983.  Low Dose CT Chest recommended if Age 73-80 years, 30 pack-year currently smoking OR have quit w/in 15years. Patient does qualify as of  December.   AAA: Not indicated at this time. The USPSTF recommends one-time screening with ultrasonography in men ages 51 to 35 years who have ever smoked ECG:  None on file; no chest pain, shortness of breath, or palpitations.  Advanced Care Planning: A voluntary discussion about advance care planning including the explanation and discussion of advance directives.  Discussed health care proxy and Living will, and the patient was able to identify a health care proxy as his maternal aunts - Jodi Geralds, Harvin Hazel, Jonelle Sports.  Patient does not have a living will at present time. If patient does have living will, I have requested they bring this to the clinic to be scanned in to their chart.  Patient Active Problem List   Diagnosis Date Noted  . Mass of right hand 03/16/2017  . Annual physical exam 03/10/2016  . Hyperglycemia 03/10/2016  . Neck pain, chronic 12/23/2015    No past surgical history on file.  Family History  Problem Relation Age of Onset  . Renal Disease Maternal Grandmother     Social History   Socioeconomic History  . Marital status: Divorced    Spouse name: Not on file  . Number of children: 3  . Years of education: Not on file  . Highest education level: Not on file  Occupational History  . Not on file  Social Needs  . Financial resource strain: Not hard at all  . Food insecurity:    Worry: Never true    Inability: Never true  . Transportation needs:    Medical: No    Non-medical: No  Tobacco Use  . Smoking status: Current Some Day Smoker    Packs/day: 0.50    Types: Cigarettes  . Smokeless tobacco: Never Used  Substance and Sexual Activity  . Alcohol use: Yes    Alcohol/week: 0.0 standard drinks    Comment: occasional  . Drug use: No  . Sexual activity: Yes    Partners: Female  Lifestyle  . Physical activity:    Days per week: 4 days    Minutes per session: 60 min  . Stress: Not at all  Relationships  . Social  connections:    Talks on phone: More than three times a week  Gets together: More than three times a week    Attends religious service: Never    Active member of club or organization: No    Attends meetings of clubs or organizations: Never    Relationship status: Divorced  . Intimate partner violence:    Fear of current or ex partner: No    Emotionally abused: No    Physically abused: No    Forced sexual activity: No  Other Topics Concern  . Not on file  Social History Narrative  . Not on file     Current Outpatient Medications:  .  fluticasone (FLONASE) 50 MCG/ACT nasal spray, Place 2 sprays into both nostrils daily. (Patient not taking: Reported on 02/01/2018), Disp: 16 g, Rfl: 0 .  naproxen (NAPROSYN) 500 MG tablet, Take 1 tablet (500 mg total) by mouth 2 (two) times daily with a meal. (Patient not taking: Reported on 03/10/2016), Disp: 30 tablet, Rfl: 0 .  Vitamin D, Ergocalciferol, (DRISDOL) 50000 units CAPS capsule, Take 1 capsule (50,000 Units total) by mouth once a week. For 12 weeks (Patient not taking: Reported on 02/16/2017), Disp: 12 capsule, Rfl: 0  No Known Allergies   ROS Constitutional: Negative for fever or weight change.  Respiratory: Negative for cough and shortness of breath.   Cardiovascular: Negative for chest pain or palpitations.  Gastrointestinal: Negative for abdominal pain, no bowel changes.  Musculoskeletal: Negative for gait problem or joint swelling.  Skin: Negative for rash.  Neurological: Negative for dizziness or headache.  No other specific complaints in a complete review of systems (except as listed in HPI above).  Objective  Vitals:   02/01/18 1305  BP: 120/68  Pulse: 81  Resp: 18  Temp: 98 F (36.7 C)  TempSrc: Oral  SpO2: 99%  Weight: 161 lb 3.2 oz (73.1 kg)  Height: 5\' 7"  (1.702 m)    Body mass index is 25.25 kg/m.  Physical Exam  Constitutional: Patient appears well-developed and well-nourished. No distress.  HENT:  Head: Normocephalic and atraumatic. Ears: B TMs ok, no erythema or effusion; Nose: Nose normal. Mouth/Throat: Oropharynx is clear and moist. No oropharyngeal exudate.  Eyes: Conjunctivae and EOM are normal. Pupils are equal, round, and reactive to light. No scleral icterus.  Neck: Normal range of motion. Neck supple. No JVD present. No thyromegaly present.  Cardiovascular: Normal rate, regular rhythm and normal heart sounds.  No murmur heard. No BLE edema. Pulmonary/Chest: Effort normal and breath sounds normal. No respiratory distress. Abdominal: Soft. Bowel sounds are normal, no distension. There is no tenderness. no masses MALE GENITALIA: Deferred  Musculoskeletal: Normal range of motion, no joint effusions. No gross deformities Neurological: he is alert and oriented to person, place, and time. No cranial nerve deficit. Coordination, balance, strength, speech and gait are normal.  Skin: Skin is warm and dry. No rash noted. No erythema.  Psychiatric: Patient has a normal mood and affect. behavior is normal. Judgment and thought content normal.  No results found for this or any previous visit (from the past 2160 hour(s)).  PHQ2/9: Depression screen [facility] 2/9 02/01/2018 03/16/2017 02/16/2017 12/23/2015  Decreased Interest 0 0 0 0  Down, Depressed, Hopeless 0 0 0 0  PHQ - 2 Score 0 0 0 0  Altered sleeping 0 - - -  Tired, decreased energy 0 - - -  Change in appetite 0 - - -  Feeling bad or failure about yourself  0 - - -  Trouble concentrating 0 - - -  Moving slowly or  fidgety/restless 0 - - -  Suicidal thoughts 0 - - -  PHQ-9 Score 0 - - -  Difficult doing work/chores Not difficult at all - - -   Fall Risk: Fall Risk  02/01/2018 03/16/2017 02/16/2017 12/23/2015  Falls in the past year? 0 No No No  Number falls in past yr: 0 - - -  Injury with Fall? 0 - - -    Assessment & Plan  1. Annual physical exam -Prostate cancer screening and PSA options (with potential risks and benefits of  testing vs not testing) were discussed along with recent recs/guidelines. -USPSTF grade A and B recommendations reviewed with patient; age-appropriate recommendations, preventive care, screening tests, etc discussed and encouraged; healthy living encouraged; see AVS for patient education given to patient -Discussed importance of 150 minutes of physical activity weekly, eat two servings of fish weekly, eat one serving of tree nuts ( cashews, pistachios, pecans, almonds.Marland Kitchen) every other day, eat 6 servings of fruit/vegetables daily and drink plenty of water and avoid sweet beverages.  - Hepatitis C antibody - HIV Antibody (routine testing w rflx) - RPR - Cytology (oral, anal, urethral) ancillary only - Lipid panel - COMPLETE METABOLIC PANEL WITH GFR  2. Routine screening for STI (sexually transmitted infection) - Hepatitis C antibody - HIV Antibody (routine testing w rflx) - RPR - Cytology (oral, anal, urethral) ancillary only  3. Hyperglycemia - COMPLETE METABOLIC PANEL WITH GFR  4. Need for hepatitis C screening test - Hepatitis C antibody  5. Mixed hyperlipidemia - Lipid panel  6. Encounter for screening for malignant neoplasm of respiratory organs - CT CHEST LUNG CA SCREEN LOW DOSE W/O CM; Future  7. Prostate cancer screening - PSA  8. Tobacco abuse counseling - CT CHEST LUNG CA SCREEN LOW DOSE W/O CM; Future - Not ready to quit; discussed Wellbutrin, Chantix, and patches and he declines today.

## 2018-02-04 ENCOUNTER — Other Ambulatory Visit: Payer: Self-pay | Admitting: Family Medicine

## 2018-02-04 DIAGNOSIS — E782 Mixed hyperlipidemia: Secondary | ICD-10-CM

## 2018-02-04 LAB — HIV ANTIBODY (ROUTINE TESTING W REFLEX): HIV 1&2 Ab, 4th Generation: NONREACTIVE

## 2018-02-04 LAB — COMPLETE METABOLIC PANEL WITH GFR
AG Ratio: 1.7 (calc) (ref 1.0–2.5)
ALT: 27 U/L (ref 9–46)
AST: 22 U/L (ref 10–35)
Albumin: 4.2 g/dL (ref 3.6–5.1)
Alkaline phosphatase (APISO): 53 U/L (ref 40–115)
BUN: 14 mg/dL (ref 7–25)
CALCIUM: 9.7 mg/dL (ref 8.6–10.3)
CO2: 29 mmol/L (ref 20–32)
CREATININE: 1.08 mg/dL (ref 0.70–1.33)
Chloride: 101 mmol/L (ref 98–110)
GFR, EST AFRICAN AMERICAN: 90 mL/min/{1.73_m2} (ref >=60)
GFR, EST NON AFRICAN AMERICAN: 77 mL/min/{1.73_m2} (ref >=60)
Globulin: 2.5 g/dL (calc) (ref 1.9–3.7)
Glucose, Bld: 87 mg/dL (ref 65–139)
Potassium: 4.3 mmol/L (ref 3.5–5.3)
Sodium: 137 mmol/L (ref 135–146)
TOTAL PROTEIN: 6.7 g/dL (ref 6.1–8.1)
Total Bilirubin: 0.4 mg/dL (ref 0.2–1.2)

## 2018-02-04 LAB — LIPID PANEL
CHOL/HDL RATIO: 3.8 (calc) (ref <5.0)
CHOLESTEROL: 195 mg/dL (ref <200)
HDL: 52 mg/dL (ref >40)
LDL CHOLESTEROL (CALC): 107 mg/dL — AB
Non-HDL Cholesterol (Calc): 143 mg/dL (calc) — ABNORMAL HIGH (ref <130)
TRIGLYCERIDES: 240 mg/dL — AB (ref <150)

## 2018-02-04 LAB — PSA: PSA: 0.6 ng/mL (ref <=4.0)

## 2018-02-04 LAB — HEPATITIS C ANTIBODY
HEP C AB: NONREACTIVE
SIGNAL TO CUT-OFF: 0.04 (ref <1.00)

## 2018-02-04 LAB — RPR: RPR Ser Ql: NONREACTIVE

## 2018-02-04 MED ORDER — ATORVASTATIN CALCIUM 40 MG PO TABS: 40.0000 mg | ORAL_TABLET | Freq: Every day | ORAL | 1 refills | Status: DC

## 2018-02-05 ENCOUNTER — Telehealth: Payer: Self-pay | Admitting: *Deleted

## 2018-02-05 NOTE — Telephone Encounter (Signed)
Spoke with patient about lung cancer screening referral. Patient verbalizes < 30 pack year history. Reporting never smoking more than .5pack per day. Patient is aware that he does not meet criteria for CT screening at this time.

## 2018-02-06 LAB — CYTOLOGY, (ORAL, ANAL, URETHRAL) ANCILLARY ONLY
CHLAMYDIA, DNA PROBE: NEGATIVE
Neisseria Gonorrhea: NEGATIVE
Trichomonas: POSITIVE — AB

## 2018-02-08 ENCOUNTER — Other Ambulatory Visit: Payer: Self-pay | Admitting: Family Medicine

## 2018-02-08 DIAGNOSIS — A599 Trichomoniasis, unspecified: Secondary | ICD-10-CM

## 2018-02-08 MED ORDER — METRONIDAZOLE 500 MG PO TABS: 2000.0000 mg | ORAL_TABLET | Freq: Once | ORAL | 0 refills | Status: AC

## 2018-03-11 ENCOUNTER — Telehealth: Payer: Self-pay | Admitting: Family Medicine

## 2018-03-11 ENCOUNTER — Other Ambulatory Visit (HOSPITAL_COMMUNITY)
Admission: RE | Admit: 2018-03-11 | Discharge: 2018-03-11 | Disposition: A | Discharge status: Home / Self Care | Payer: 59 | Source: Ambulatory Visit | Attending: Family Medicine | Admitting: Family Medicine

## 2018-03-11 DIAGNOSIS — A599 Trichomoniasis, unspecified: Secondary | ICD-10-CM | POA: Insufficient documentation

## 2018-03-11 NOTE — Telephone Encounter (Deleted)
Hey I will need an order.

## 2018-03-11 NOTE — Telephone Encounter (Signed)
Patient will be in for urine recheck today.

## 2018-03-11 NOTE — Addendum Note (Signed)
Addended by: Chilton Greathouse on: 03/11/2018 01:11 PM   Modules accepted: Orders

## 2018-03-11 NOTE — Telephone Encounter (Signed)
Please have patient come back in for trichomonas recheck - urine sample/nurse visit only.

## 2018-03-11 NOTE — Telephone Encounter (Signed)
Called patient. No answer. No vm

## 2018-03-15 LAB — URINE CYTOLOGY ANCILLARY ONLY: Trichomonas: NEGATIVE

## 2018-03-19 ENCOUNTER — Telehealth: Payer: Self-pay | Admitting: Family Medicine

## 2018-03-19 DIAGNOSIS — E782 Mixed hyperlipidemia: Secondary | ICD-10-CM

## 2018-03-19 NOTE — Telephone Encounter (Signed)
Please have patient come in for fasting lipid panel in the next 1-2 weeks at his convenience.

## 2018-03-19 NOTE — Telephone Encounter (Signed)
-----   Message from [name], FNP sent at 02/04/2018  2:36 PM EST ----- Regarding: Call patient to come in for fasting lipid panel Call patient to come in for fasting lipid panel

## 2018-03-20 NOTE — Telephone Encounter (Signed)
Called patient, mailbox is full. Cannot leave message

## 2018-05-16 ENCOUNTER — Other Ambulatory Visit: Payer: Self-pay

## 2018-05-16 ENCOUNTER — Encounter: Payer: Self-pay | Admitting: Emergency Medicine

## 2018-05-16 DIAGNOSIS — Z79899 Other long term (current) drug therapy: Secondary | ICD-10-CM | POA: Diagnosis not present

## 2018-05-16 DIAGNOSIS — N320 Bladder-neck obstruction: Secondary | ICD-10-CM | POA: Insufficient documentation

## 2018-05-16 DIAGNOSIS — R39198 Other difficulties with micturition: Secondary | ICD-10-CM | POA: Diagnosis present

## 2018-05-16 DIAGNOSIS — F17210 Nicotine dependence, cigarettes, uncomplicated: Secondary | ICD-10-CM | POA: Insufficient documentation

## 2018-05-16 LAB — URINALYSIS, COMPLETE (UACMP) WITH MICROSCOPIC
Bacteria, UA: NONE SEEN
Bilirubin Urine: NEGATIVE
GLUCOSE, UA: NEGATIVE mg/dL
HGB URINE DIPSTICK: NEGATIVE
Ketones, ur: NEGATIVE mg/dL
Leukocytes,Ua: NEGATIVE
Nitrite: NEGATIVE
PROTEIN: NEGATIVE mg/dL
Specific Gravity, Urine: 1.001 — ABNORMAL LOW (ref 1.005–1.030)
Squamous Epithelial / LPF: NONE SEEN (ref 0–5)
pH: 6 (ref 5.0–8.0)

## 2018-05-16 NOTE — ED Triage Notes (Addendum)
Patient ambulatory to triage with steady gait, without difficulty or distress noted; pt reports having slow urine stream since yesterday; denies abd or back pain, st "I just have to force it out"

## 2018-05-17 ENCOUNTER — Emergency Department
Admission: EM | Admit: 2018-05-17 | Discharge: 2018-05-17 | Disposition: A | Discharge status: Home / Self Care | Payer: 59 | Attending: Emergency Medicine | Admitting: Emergency Medicine

## 2018-05-17 ENCOUNTER — Emergency Department: Payer: 59

## 2018-05-17 DIAGNOSIS — N320 Bladder-neck obstruction: Secondary | ICD-10-CM

## 2018-05-17 MED ORDER — TAMSULOSIN HCL 0.4 MG PO CAPS
0.4000 mg | ORAL_CAPSULE | ORAL | Status: AC
  Administered 2018-05-17: 0.4 mg via ORAL
  Filled 2018-05-17: qty 1

## 2018-05-17 MED ORDER — TAMSULOSIN HCL 0.4 MG PO CAPS: ORAL_CAPSULE | ORAL | 0 refills | Status: DC

## 2018-05-17 NOTE — ED Notes (Signed)
Patient discharged to home per MD order. Patient in stable condition, and deemed medically cleared by ED provider for discharge. Discharge instructions reviewed with patient/family using "Teach Back"; verbalized understanding of medication education and administration, and information about follow-up care. Denies further concerns. ° °

## 2018-05-17 NOTE — ED Provider Notes (Signed)
[facility] Emergency Department Provider Note  ____________________________________________   First MD Initiated Contact with Patient 05/17/18 [phone]     (approximate)  I have reviewed the triage vital signs and the nursing notes.   HISTORY  Chief Complaint Urinary Frequency    HPI Joel Clay is a 58 y.o. male with medical history as listed below who presents for evaluation of difficulty with urination.  He reports that it is been difficult for him to urinate recently and that he feels like he has to "force out a stream".  He denies any pain in his penis, testicles, or scrotum.  He has no swelling in his genitals either.  He has no burning with urination.  He just states that it takes more effort than usual to go to the bathroom.  He has had kidney stones in the past but this does not feel like kidney stones.  He had a similar episode a few years ago which he believes was due to a kidney stone.  He denies fever/chills, chest pain, shortness of breath, nausea, vomiting, and abdominal pain.  Nothing particular makes his symptoms better or worse and he describes them as moderate in intensity.         Past Medical History:  Diagnosis Date  . Benign enlargement of prostate   . Elevated PSA   . Epididymitis   . Hematuria, microscopic   . Testicular pain, left   . Tobacco abuse     Patient Active Problem List   Diagnosis Date Noted  . Tobacco abuse counseling 02/01/2018  . Mixed hyperlipidemia 02/01/2018  . Mass of right hand 03/16/2017  . Annual physical exam 03/10/2016  . Hyperglycemia 03/10/2016  . Neck pain, chronic 12/23/2015    History reviewed. No pertinent surgical history.  Prior to Admission medications   Medication Sig Start Date End Date Taking? Authorizing Provider  atorvastatin (LIPITOR) 40 MG tablet Take 1 tablet (40 mg total) by mouth daily. 02/04/18   [name], FNP  tamsulosin (FLOMAX) 0.4 MG CAPS capsule Take 1 tablet by  mouth daily until you pass the kidney stone or no longer have symptoms 05/17/18   Hinda Kehr, MD    Allergies Patient has no known allergies.  Family History  Problem Relation Age of Onset  . Renal Disease Maternal Grandmother   . Diabetes Maternal Grandmother   . Diabetes Maternal Grandfather   . Cancer Maternal Aunt        Unknown type  . Diabetes Maternal Aunt     Social History Social History   Tobacco Use  . Smoking status: Current Some Day Smoker    Packs/day: 0.25    Types: Cigarettes  . Smokeless tobacco: Never Used  Substance Use Topics  . Alcohol use: Yes    Alcohol/week: 0.0 standard drinks    Comment: occasional  . Drug use: No    Review of Systems Constitutional: No fever/chills Cardiovascular: Denies chest pain. Respiratory: Denies shortness of breath. Gastrointestinal: No abdominal pain.  No nausea, no vomiting.  No diarrhea.  No constipation. Genitourinary: Urinary hesitancy and increased effort to urinate.  No dysuria, no hematuria. Neurological: Negative for headaches, focal weakness or numbness.   ____________________________________________   PHYSICAL EXAM:  VITAL SIGNS: ED Triage Vitals  Enc Vitals Group     BP 05/16/18 2224 130/77     Pulse Rate 05/16/18 2224 81     Resp 05/16/18 2224 18     Temp 05/16/18 2224 97.9 F (36.6  C)     Temp Source 05/16/18 2224 Oral     SpO2 05/16/18 2224 97 %     Weight 05/16/18 2223 72.6 kg (160 lb)     Height 05/16/18 2223 1.702 m (5\' 7" )     Head Circumference --      Peak Flow --      Pain Score 05/16/18 2223 0     Pain Loc --      Pain Edu? --      Excl. in [place]? --     Constitutional: Alert and oriented. Well appearing and in no acute distress. Eyes: Conjunctivae are normal.  Head: Atraumatic. Cardiovascular: Normal rate, regular rhythm. Good peripheral circulation. Grossly normal heart sounds. Respiratory: Normal respiratory effort.  No retractions. Lungs CTAB. Gastrointestinal: Soft and  nontender. No distention.  Genitourinary: Normal external male circumcised genitalia.  No penile discharge, no phimosis or paraphimosis, no testicular tenderness including epididymal tenderness, no scrotal edema.  No scrotal crepitus. Musculoskeletal: No lower extremity tenderness nor edema. No gross deformities of extremities. Neurologic:  Normal speech and language. No gross focal neurologic deficits are appreciated.  Skin:  Skin is warm, dry and intact. No rash noted. Psychiatric: Mood and affect are normal. Speech and behavior are normal.  ____________________________________________   LABS (all labs ordered are listed, but only abnormal results are displayed)  Labs Reviewed  URINALYSIS, COMPLETE (UACMP) WITH MICROSCOPIC - Abnormal; Notable for the following components:      Result Value   Color, Urine COLORLESS (*)    APPearance CLEAR (*)    Specific Gravity, Urine 1.001 (*)    All other components within normal limits   ____________________________________________  EKG  None - EKG not ordered by ED physician ____________________________________________  RADIOLOGY   ED MD interpretation: No acute findings.  The patient has some bladder wall thickening that could correlate with chronic outlet obstruction.  The bladder is not overtly distended.  He has a right renal calculus but no ureteral stones.  Official radiology report(s): Ct Renal Stone Study  Result Date: 05/17/2018 CLINICAL DATA:  Painless slow urine stream since yesterday. EXAM: CT ABDOMEN AND PELVIS WITHOUT CONTRAST TECHNIQUE: Multidetector CT imaging of the abdomen and pelvis was performed following the standard protocol without IV contrast. COMPARISON:  None. FINDINGS: Lower chest:  No contributory findings. Hepatobiliary: 9 mm low but not clearly cystic density in the right liver, nonspecific in isolation. There is no chart history of malignancy. Consider 6 month follow-up ultrasound. No evidence of biliary  obstruction or stone. Pancreas: Unremarkable. Spleen: Unremarkable. Adrenals/Urinary Tract: Negative adrenals. No hydronephrosis or ureteral stone. 3 mm right lower pole renal calculus. Generalized mild bladder wall thickening possible trabeculation. No perivesicular stranding or gross masslike appearance. Stomach/Bowel: No obstruction. No appendicitis. Rare left colonic diverticula. Vascular/Lymphatic: No acute vascular abnormality. Atherosclerotic calcification. No mass or adenopathy. Reproductive:No pathologic findings. Other: No ascites or pneumoperitoneum. Musculoskeletal: No acute abnormalities. IMPRESSION: 1. No acute finding. 2. Mild circumferential bladder wall thickening needing correlation with urinalysis or history of chronic outlet obstruction. No over distention of the bladder currently. 3. 3 mm right renal calculus. 4. 9 mm lesion in the right liver that is nonspecific in isolation. Consider 6 month follow-up ultrasound. Electronically Signed   By: Monte Fantasia M.D.   On: 05/17/2018 05:31    ____________________________________________   PROCEDURES   Procedure(s) performed (including Critical Care):  Procedures   ____________________________________________   INITIAL IMPRESSION / MDM / [place] / ED COURSE  As  part of my medical decision making, I reviewed the following data within the [facility] notes reviewed and incorporated, Labs reviewed  and Notes from prior ED visits         The patient has no signs or symptoms of infection and he does have elevated PSA as well as BPH listed on his medical history.  I think that most likely he has BPH which is causing chronic outlet obstruction which describes getting worse over time.  His CT scan was nonacute and did not show any sign of tumors.  I stressed to him the importance of urological follow-up, however, because he needs a further and broader evaluation.  In the meantime I have started him  on Flomax and given him contact information for Dr. Erlene Quan.  He understands and agrees with the plan.     ____________________________________________  FINAL CLINICAL IMPRESSION(S) / ED DIAGNOSES  Final diagnoses:  Bladder outlet obstruction     MEDICATIONS GIVEN DURING THIS VISIT:  Medications  tamsulosin (FLOMAX) capsule 0.4 mg (0.4 mg Oral Given 05/17/18 0547)     ED Discharge Orders         Ordered    tamsulosin (FLOMAX) 0.4 MG CAPS capsule     05/17/18 0602           Note:  This document was prepared using Dragon voice recognition software and may include unintentional dictation errors.   Hinda Kehr, MD 05/17/18 [phone]

## 2018-05-17 NOTE — Discharge Instructions (Signed)
As we discussed, we believe the difficulty you are experiencing with urination is most likely due to a condition called benign prostatic hyperplasia, where your prostate is too large and is pressing on your urethra which makes it difficult to urinate.  We started you on a medication called Flomax which should help, but we strongly encourage you to follow-up with urology for further evaluation.  They will likely want to do some additional testing to make sure that there are no other conditions causing your symptoms.  Return to the emergency department if you develop new or worsening symptoms that concern you.

## 2018-05-30 ENCOUNTER — Encounter: Payer: Self-pay | Admitting: Urology

## 2018-05-30 ENCOUNTER — Other Ambulatory Visit: Payer: Self-pay

## 2018-05-30 ENCOUNTER — Ambulatory Visit: Payer: 59 | Admitting: Urology

## 2018-05-30 VITALS — BP 114/70 | HR 66 | Ht 67.0 in | Wt 161.5 lb

## 2018-05-30 DIAGNOSIS — R3916 Straining to void: Secondary | ICD-10-CM | POA: Diagnosis not present

## 2018-05-30 DIAGNOSIS — N401 Enlarged prostate with lower urinary tract symptoms: Secondary | ICD-10-CM

## 2018-05-30 NOTE — Progress Notes (Signed)
05/30/2018 2:48 PM   Joel Clay 11-Nov-1960 [id number]  Referring provider: [name], [place], [place] [address]  Chief Complaint  Patient presents with  . Hospitalization Follow-up    HPI:  Joel Clay is a 58 year old male who presented to the [facility] ED on 05/17/2018 complaining of difficulty with urination.  He complained of a several week history of worsening voiding symptoms including urinary hesitancy, straining to urinate and only dribbling without generating a stream.  Denied dysuria or gross hematuria.  He has a prior history of stone disease and epididymitis but denied flank/abdominal/pelvic or scrotal pain.  Symptom severity was rated severe.  Denied fever, chills, nausea or vomiting.  There were no identifiable precipitating, aggravating or alleviating factors to his symptoms.  Evaluation in the ED included a urinalysis which was unremarkable.  A stone protocol CT of the abdomen pelvis was performed which showed a nonobstructing 3 mm right lower pole renal calculus, no ureteral calculi or hydronephrosis was seen.  The bladder wall was thickened but no bladder distention was noted.  He was started on tamsulosin and urology follow-up was recommended.  He has noted improvement in his voiding pattern on tamsulosin however does not feel his symptoms are where they need to be.  He still complains of a weak urinary stream, urinary hesitancy and straining to urinate.  He has nocturia x2.  IPSS completed today was 10/35.  A PSA performed November 2019 was 0.6   PMH: Past Medical History:  Diagnosis Date  . Benign enlargement of prostate   . Elevated PSA   . Epididymitis   . Hematuria, microscopic   . Testicular pain, left   . Tobacco abuse     Surgical History: No past surgical history on file.  Home Medications:  Allergies as of 05/30/2018   No Known Allergies     Medication List       Accurate as of May 30, 2018  2:48 PM. Always  use your most recent med list.        atorvastatin 40 MG tablet Commonly known as:  LIPITOR Take 1 tablet (40 mg total) by mouth daily.   tamsulosin 0.4 MG Caps capsule Commonly known as:  FLOMAX Take 1 tablet by mouth daily until you pass the kidney stone or no longer have symptoms       Allergies: No Known Allergies  Family History: Family History  Problem Relation Age of Onset  . Renal Disease Maternal Grandmother   . Diabetes Maternal Grandmother   . Diabetes Maternal Grandfather   . Cancer Maternal Aunt        Unknown type  . Diabetes Maternal Aunt     Social History:  reports that he has been smoking cigarettes. He has been smoking about 0.25 packs per day. He has never used smokeless tobacco. He reports current alcohol use. He reports that he does not use drugs.  ROS: UROLOGY Frequent Urination?: No Hard to postpone urination?: No Burning/pain with urination?: No Get up at night to urinate?: Yes Leakage of urine?: No Urine stream starts and stops?: Yes Trouble starting stream?: No Do you have to strain to urinate?: Yes Blood in urine?: No Urinary tract infection?: No Sexually transmitted disease?: No Injury to kidneys or bladder?: No Painful intercourse?: No Weak stream?: Yes Erection problems?: No Penile pain?: No  Gastrointestinal Nausea?: No Vomiting?: No Indigestion/heartburn?: No Diarrhea?: No Constipation?: No  Constitutional Fever: No Night sweats?: No Weight loss?: No Fatigue?:  No  Skin Skin rash/lesions?: No Itching?: No  Eyes Blurred vision?: No Double vision?: No  Ears/Nose/Throat Sore throat?: No Sinus problems?: No  Hematologic/Lymphatic Swollen glands?: No Easy bruising?: No  Cardiovascular Leg swelling?: No Chest pain?: No  Respiratory Cough?: No Shortness of breath?: No  Endocrine Excessive thirst?: No  Musculoskeletal Back pain?: Yes Joint pain?: No  Neurological Headaches?: No Dizziness?: No   Psychologic Depression?: No Anxiety?: No  Physical Exam: BP 114/70 (BP Location: Left Arm, Patient Position: Sitting, Cuff Size: Normal)   Pulse 66   Ht 5\' 7"  (1.702 m)   Wt 161 lb 8 oz (73.3 kg)   BMI 25.29 kg/m   Constitutional:  Alert and oriented, No acute distress. HEENT: [place] AT, moist mucus membranes.  Trachea midline, no masses. Cardiovascular: No clubbing, cyanosis, or edema. Respiratory: Normal respiratory effort, no increased work of breathing. GI: Abdomen is soft, nontender, nondistended, no abdominal masses GU: No CVA tenderness.  Prostate 40 g, smooth without nodules or induration Lymph: No cervical or inguinal lymphadenopathy. Skin: No rashes, bruises or suspicious lesions. Neurologic: Grossly intact, no focal deficits, moving all 4 extremities. Psychiatric: Normal mood and affect.  Laboratory Data: Lab Results  Component Value Date   WBC 5.2 03/16/2017   HGB 14.2 03/16/2017   HCT 43.6 03/16/2017   MCV 88.6 03/16/2017   PLT 310 03/16/2017    Lab Results  Component Value Date   CREATININE 1.08 02/01/2018    Lab Results  Component Value Date   PSA 0.6 02/01/2018   PSA 0.9 03/16/2017   PSA 2.6 03/10/2016     Assessment & Plan:    58 year old male with obstructive voiding symptoms most likely secondary to BPH.  Recent urinalysis was clear.  DRE is unremarkable and PSA is low.  He has noted improvement on tamsulosin but still has bothersome voiding symptoms.  No significant PVR by bladder scan  Will schedule cystoscopy for further evaluation of his outlet.  Potential additional treatment options were discussed including adding a 5-ARI, minimally invasive procedures including UroLift and outlet procedures, i.e. TURP/PVP.  In the meantime will increase his tamsulosin to 0.8 mg.   Abbie Sons, MD  [facility] [address], [place], [place] [address] [phone]

## 2018-05-30 NOTE — Patient Instructions (Signed)

## 2018-05-31 ENCOUNTER — Encounter: Payer: Self-pay | Admitting: Urology

## 2018-05-31 MED ORDER — TAMSULOSIN HCL 0.4 MG PO CAPS: 0.8000 mg | ORAL_CAPSULE | Freq: Every day | ORAL | 0 refills | Status: DC

## 2018-06-14 ENCOUNTER — Other Ambulatory Visit: Payer: 59 | Admitting: Urology

## 2018-07-12 ENCOUNTER — Other Ambulatory Visit: Payer: 59 | Admitting: Urology

## 2018-08-23 ENCOUNTER — Other Ambulatory Visit: Payer: 59 | Admitting: Urology

## 2018-09-27 ENCOUNTER — Other Ambulatory Visit: Payer: 59 | Admitting: Urology

## 2018-10-24 ENCOUNTER — Other Ambulatory Visit: Payer: Self-pay

## 2018-10-24 ENCOUNTER — Ambulatory Visit: Payer: 59 | Admitting: Urology

## 2018-10-24 ENCOUNTER — Encounter: Payer: Self-pay | Admitting: Urology

## 2018-10-24 VITALS — BP 133/81 | HR 62 | Ht 67.0 in | Wt 161.0 lb

## 2018-10-24 DIAGNOSIS — Z01818 Encounter for other preprocedural examination: Secondary | ICD-10-CM

## 2018-10-24 DIAGNOSIS — N35914 Unspecified anterior urethral stricture, male: Secondary | ICD-10-CM

## 2018-10-24 LAB — URINALYSIS, COMPLETE
Bilirubin, UA: NEGATIVE
Glucose, UA: NEGATIVE
Ketones, UA: NEGATIVE
Leukocytes,UA: NEGATIVE
Nitrite, UA: NEGATIVE
Protein,UA: NEGATIVE
RBC, UA: NEGATIVE
Specific Gravity, UA: 1.025 (ref 1.005–1.030)
Urobilinogen, Ur: 0.2 mg/dL (ref 0.2–1.0)
pH, UA: 5.5 (ref 5.0–7.5)

## 2018-10-24 LAB — MICROSCOPIC EXAMINATION
Bacteria, UA: NONE SEEN
RBC, Urine: NONE SEEN /hpf (ref 0–2)

## 2018-10-24 MED ORDER — LIDOCAINE HCL URETHRAL/MUCOSAL 2 % EX GEL
1.0000 "application " | Freq: Once | CUTANEOUS | Status: AC
  Administered 2018-10-24: 1 via URETHRAL

## 2018-10-24 NOTE — Progress Notes (Signed)
   10/24/18  CC:  Chief Complaint  Patient presents with  . Benign Prostatic Hypertrophy    Indications: - Obstructive voiding symptoms  HPI: Seen 05/30/2018 with obstructive voiding symptoms.  He did note improvement in his symptoms with titration of tamsulosin dose to 0.8 mg.  Blood pressure 133/81, pulse 62, height 5\' 7"  (1.702 m), weight 161 lb (73 kg). NED. A&Ox3.   No respiratory distress   Abd soft, NT, ND Normal phallus with bilateral descended testicles  Cystoscopy Procedure Note  Patient identification was confirmed, informed consent was obtained, and patient was prepped using Betadine solution.  Lidocaine jelly was administered per urethral meatus.     Pre-Procedure: - Inspection reveals a normal caliber urethral meatus.  Procedure: The flexible cystoscope was introduced without difficulty - Anterior urethral stricture present which would not allow passage of the flexible cystoscope.  Estimated approximately 12 [place] - Remainder of cystoscopy unable to be performed secondary to anterior stricture   Post-Procedure: - Patient tolerated the procedure well  Assessment/ Plan: Anterior urethral stricture.  Difficult to ascertain if this is the cause of his voiding symptoms.  I discussed urethral dilation which she was not going to be able to tolerate in office.  He desires to schedule under anesthesia/sedation.  The procedure was discussed in detail including potential risks of bleeding, infection and the possibility of recurrent stricture.  He indicated all questions were answered and desires to proceed.   Abbie Sons, MD

## 2018-10-25 ENCOUNTER — Encounter: Payer: Self-pay | Admitting: Urology

## 2018-10-25 NOTE — Progress Notes (Signed)
   10/24/2018 8:34 AM   Joel Clay 1960-05-15 [id number]  Referring provider: [name], [place],  [place] [address]  Chief Complaint  Patient presents with  . Benign Prostatic Hypertrophy    HPI: 58 y.o. male seen for obstructive voiding symptoms.  He noted some improvement with tamsulosin with 0.8 mg titration.  Urethroscopy performed today remarkable for a anterior urethral stricture which would not allow passage of the cystoscope.  He declined in office dilation and presents for urethral dilation under anesthesia.  PMH: Past Medical History:  Diagnosis Date  . Benign enlargement of prostate   . Elevated PSA   . Epididymitis   . Hematuria, microscopic   . Testicular pain, left   . Tobacco abuse     Surgical History: No past surgical history on file.  Home Medications:  Allergies as of 10/24/2018   No Known Allergies     Medication List       Accurate as of October 24, 2018 11:59 PM. If you have any questions, ask your nurse or doctor.        atorvastatin 40 MG tablet Commonly known as: LIPITOR Take 1 tablet (40 mg total) by mouth daily.   tamsulosin 0.4 MG Caps capsule Commonly known as: FLOMAX Take 2 capsules (0.8 mg total) by mouth daily.       Allergies: No Known Allergies  Family History: Family History  Problem Relation Age of Onset  . Renal Disease Maternal Grandmother   . Diabetes Maternal Grandmother   . Diabetes Maternal Grandfather   . Cancer Maternal Aunt        Unknown type  . Diabetes Maternal Aunt     Social History:  reports that he has been smoking cigarettes. He has been smoking about 0.25 packs per day. He has never used smokeless tobacco. He reports current alcohol use. He reports that he does not use drugs.  ROS: No significant change from 05/30/2018  Physical Exam: BP 133/81 (BP Location: Left Arm, Patient Position: Sitting, Cuff Size: Normal)   Pulse 62   Ht 5\' 7"  (1.702 m)   Wt 161 lb  (73 kg)   BMI 25.22 kg/m   Constitutional:  Alert and oriented, No acute distress. HEENT: [place] AT, moist mucus membranes.  Trachea midline, no masses. Cardiovascular: No clubbing, cyanosis, or edema.  RRR Respiratory: Normal respiratory effort, no increased work of breathing.  Clear GI: Abdomen is soft, nontender, nondistended, no abdominal masses GU: No CVA tenderness Lymph: No cervical or inguinal lymphadenopathy. Skin: No rashes, bruises or suspicious lesions. Neurologic: Grossly intact, no focal deficits, moving all 4 extremities. Psychiatric: Normal mood and affect.     Assessment & Plan:    - Anterior urethral stricture He presents for cystoscopy with urethral dilation under anesthesia.  The procedure has been discussed in detail as per cystoscopy note of this date.   Abbie Sons, [place] [address], [place], [place] [address] [phone]

## 2018-10-25 NOTE — H&P (View-Only) (Signed)
   10/24/2018 8:34 AM   Joel Clay 12/19/1960 [id number]  Referring provider: [name], [place],  [place] [address]  Chief Complaint  Patient presents with  . Benign Prostatic Hypertrophy    HPI: 58 y.o. male seen for obstructive voiding symptoms.  He noted some improvement with tamsulosin with 0.8 mg titration.  Urethroscopy performed today remarkable for a anterior urethral stricture which would not allow passage of the cystoscope.  He declined in office dilation and presents for urethral dilation under anesthesia.  PMH: Past Medical History:  Diagnosis Date  . Benign enlargement of prostate   . Elevated PSA   . Epididymitis   . Hematuria, microscopic   . Testicular pain, left   . Tobacco abuse     Surgical History: No past surgical history on file.  Home Medications:  Allergies as of 10/24/2018   No Known Allergies     Medication List       Accurate as of October 24, 2018 11:59 PM. If you have any questions, ask your nurse or doctor.        atorvastatin 40 MG tablet Commonly known as: LIPITOR Take 1 tablet (40 mg total) by mouth daily.   tamsulosin 0.4 MG Caps capsule Commonly known as: FLOMAX Take 2 capsules (0.8 mg total) by mouth daily.       Allergies: No Known Allergies  Family History: Family History  Problem Relation Age of Onset  . Renal Disease Maternal Grandmother   . Diabetes Maternal Grandmother   . Diabetes Maternal Grandfather   . Cancer Maternal Aunt        Unknown type  . Diabetes Maternal Aunt     Social History:  reports that he has been smoking cigarettes. He has been smoking about 0.25 packs per day. He has never used smokeless tobacco. He reports current alcohol use. He reports that he does not use drugs.  ROS: No significant change from 05/30/2018  Physical Exam: BP 133/81 (BP Location: Left Arm, Patient Position: Sitting, Cuff Size: Normal)   Pulse 62   Ht 5\' 7"  (1.702 m)   Wt 161 lb  (73 kg)   BMI 25.22 kg/m   Constitutional:  Alert and oriented, No acute distress. HEENT: [place] AT, moist mucus membranes.  Trachea midline, no masses. Cardiovascular: No clubbing, cyanosis, or edema.  RRR Respiratory: Normal respiratory effort, no increased work of breathing.  Clear GI: Abdomen is soft, nontender, nondistended, no abdominal masses GU: No CVA tenderness Lymph: No cervical or inguinal lymphadenopathy. Skin: No rashes, bruises or suspicious lesions. Neurologic: Grossly intact, no focal deficits, moving all 4 extremities. Psychiatric: Normal mood and affect.     Assessment & Plan:    - Anterior urethral stricture He presents for cystoscopy with urethral dilation under anesthesia.  The procedure has been discussed in detail as per cystoscopy note of this date.   Abbie Sons, [place] [address] [place], [place] [address] [phone]

## 2018-10-27 LAB — CULTURE, URINE COMPREHENSIVE

## 2018-11-01 ENCOUNTER — Other Ambulatory Visit
Admission: RE | Admit: 2018-11-01 | Discharge: 2018-11-01 | Disposition: A | Discharge status: Home / Self Care | Payer: 59 | Source: Ambulatory Visit | Attending: Urology | Admitting: Urology

## 2018-11-01 ENCOUNTER — Other Ambulatory Visit: Payer: Self-pay

## 2018-11-01 DIAGNOSIS — Z01812 Encounter for preprocedural laboratory examination: Secondary | ICD-10-CM | POA: Insufficient documentation

## 2018-11-01 DIAGNOSIS — Z20828 Contact with and (suspected) exposure to other viral communicable diseases: Secondary | ICD-10-CM | POA: Diagnosis not present

## 2018-11-01 LAB — SARS CORONAVIRUS 2 (TAT 6-24 HRS): SARS Coronavirus 2: NEGATIVE

## 2018-11-05 ENCOUNTER — Other Ambulatory Visit: Payer: Self-pay

## 2018-11-05 ENCOUNTER — Other Ambulatory Visit
Admission: RE | Admit: 2018-11-05 | Discharge: 2018-11-05 | Disposition: A | Discharge status: Home / Self Care | Payer: 59 | Source: Ambulatory Visit | Attending: Urology | Admitting: Urology

## 2018-11-05 ENCOUNTER — Encounter: Payer: Self-pay | Admitting: *Deleted

## 2018-11-05 MED ORDER — CEFAZOLIN SODIUM-DEXTROSE 2-4 GM/100ML-% IV SOLN
2.0000 g | Freq: Once | INTRAVENOUS | Status: AC
  Administered 2018-11-06: 2 g via INTRAVENOUS

## 2018-11-05 NOTE — Patient Instructions (Signed)
Your procedure is scheduled on: 11-06-18  Report to Same Day Surgery 2nd floor medical mall [facility] Entrance-take elevator on left to 2nd floor.  Check in with surgery information desk.) @ 11 per pt   Remember: Instructions that are not followed completely may result in serious medical risk, up to and including death, or upon the discretion of your surgeon and anesthesiologist your surgery may need to be rescheduled.    _x___ 1. Do not eat food after midnight the night before your procedure. You may drink clear liquids up to 2 hours before you are scheduled to arrive at the hospital for your procedure.  Do not drink clear liquids within 2 hours of your scheduled arrival to the hospital.  Clear liquids include  --Water or Apple juice without pulp  --Clear carbohydrate beverage such as ClearFast or Gatorade  --Black Coffee or Clear Tea (No milk, no creamers, do not add anything to the coffee or Tea   ____Ensure clear carbohydrate drink on the way to the hospital for bariatric patients  ____Ensure clear carbohydrate drink 3 hours before surgery.   No gum chewing or hard candies.     __x__ 2. No Alcohol for 24 hours before or after surgery.   __x__3. No Smoking or e-cigarettes for 24 prior to surgery.  Do not use any chewable tobacco products for at least 6 hour prior to surgery   ____  4. Bring all medications with you on the day of surgery if instructed.    __x__ 5. Notify your doctor if there is any change in your medical condition     (cold, fever, infections).    x___6. On the morning of surgery brush your teeth with toothpaste and water.  You may rinse your mouth with mouth wash if you wish.  Do not swallow any toothpaste or mouthwash.   Do not wear jewelry, make-up, hairpins, clips or nail polish.  Do not wear lotions, powders, or perfumes. You may wear deodorant.  Do not shave 48 hours prior to surgery. Men may shave face and neck.  Do not bring valuables to the hospital.     [facility] is not responsible for any belongings or valuables.               Contacts, dentures or bridgework may not be worn into surgery.  Leave your suitcase in the car. After surgery it may be brought to your room.  For patients admitted to the hospital, discharge time is determined by your  treatment team.  _  Patients discharged the day of surgery will not be allowed to drive home.  You will need someone to drive you home and stay with you the night of your procedure.    Please read over the following fact sheets that you were given:   [facility] Preparing for Surgery  ____ Take anti-hypertensive listed below, cardiac, seizure, asthma, anti-reflux and psychiatric medicines. These include:  1. NONE  2.  3.  4.  5.  6.  ____Fleets enema or Magnesium Citrate as directed.   ____ Use CHG Soap or sage wipes as directed on instruction sheet   ____ Use inhalers on the day of surgery and bring to hospital day of surgery  ____ Stop Metformin and Janumet 2 days prior to surgery.    ____ Take 1/2 of usual insulin dose the night before surgery and none on the morning  surgery.   ____ Follow recommendations from Cardiologist, Pulmonologist or PCP regarding stopping Aspirin,  Coumadin, Plavix ,Eliquis, Effient, or Pradaxa, and Pletal.  X____Stop Anti-inflammatories such as Advil, Aleve, Ibuprofen, Motrin, Naproxen, Naprosyn, Goodies powders or aspirin products NOW-OK to take Tylenol    ____ Stop supplements until after surgery.   ____ Bring C-Pap to the hospital.

## 2018-11-06 ENCOUNTER — Encounter
Admission: RE | Disposition: A | Discharge status: Home / Self Care | Payer: Self-pay | Source: Home / Self Care | Attending: Urology

## 2018-11-06 ENCOUNTER — Ambulatory Visit: Payer: 59 | Admitting: Anesthesiology

## 2018-11-06 ENCOUNTER — Other Ambulatory Visit: Payer: Self-pay

## 2018-11-06 ENCOUNTER — Ambulatory Visit
Admission: RE | Admit: 2018-11-06 | Discharge: 2018-11-06 | Disposition: A | Discharge status: Home / Self Care | Payer: 59 | Attending: Urology | Admitting: Urology

## 2018-11-06 DIAGNOSIS — N138 Other obstructive and reflux uropathy: Secondary | ICD-10-CM | POA: Insufficient documentation

## 2018-11-06 DIAGNOSIS — Z79899 Other long term (current) drug therapy: Secondary | ICD-10-CM | POA: Insufficient documentation

## 2018-11-06 DIAGNOSIS — F17210 Nicotine dependence, cigarettes, uncomplicated: Secondary | ICD-10-CM | POA: Insufficient documentation

## 2018-11-06 DIAGNOSIS — N35919 Unspecified urethral stricture, male, unspecified site: Secondary | ICD-10-CM | POA: Insufficient documentation

## 2018-11-06 DIAGNOSIS — N35812 Other urethral bulbous stricture, male: Secondary | ICD-10-CM

## 2018-11-06 DIAGNOSIS — N401 Enlarged prostate with lower urinary tract symptoms: Secondary | ICD-10-CM | POA: Insufficient documentation

## 2018-11-06 DIAGNOSIS — N35914 Unspecified anterior urethral stricture, male: Secondary | ICD-10-CM

## 2018-11-06 DIAGNOSIS — N35912 Unspecified bulbous urethral stricture, male: Secondary | ICD-10-CM | POA: Insufficient documentation

## 2018-11-06 DIAGNOSIS — N35814 Other anterior urethral stricture, male: Secondary | ICD-10-CM | POA: Diagnosis not present

## 2018-11-06 HISTORY — PX: CYSTOSCOPY WITH URETHRAL DILATATION: SHX5125

## 2018-11-06 HISTORY — DX: Gastro-esophageal reflux disease without esophagitis: K21.9

## 2018-11-06 HISTORY — DX: Personal history of urinary calculi: Z87.442

## 2018-11-06 SURGERY — CYSTOSCOPY, WITH URETHRAL DILATION
Anesthesia: General

## 2018-11-06 MED ORDER — MIDAZOLAM HCL 2 MG/2ML IJ SOLN
INTRAMUSCULAR | Status: AC
  Filled 2018-11-06: qty 2

## 2018-11-06 MED ORDER — LIDOCAINE HCL (CARDIAC) PF 100 MG/5ML IV SOSY
PREFILLED_SYRINGE | INTRAVENOUS | Status: DC | PRN
  Administered 2018-11-06: 80 mg via INTRAVENOUS

## 2018-11-06 MED ORDER — CEFAZOLIN SODIUM-DEXTROSE 2-4 GM/100ML-% IV SOLN
INTRAVENOUS | Status: AC
  Filled 2018-11-06: qty 100

## 2018-11-06 MED ORDER — FENTANYL CITRATE (PF) 100 MCG/2ML IJ SOLN
INTRAMUSCULAR | Status: AC
  Filled 2018-11-06: qty 2

## 2018-11-06 MED ORDER — ONDANSETRON HCL 4 MG/2ML IJ SOLN
INTRAMUSCULAR | Status: AC
  Filled 2018-11-06: qty 2

## 2018-11-06 MED ORDER — IOHEXOL 180 MG/ML  SOLN
INTRAMUSCULAR | Status: DC | PRN
  Administered 2018-11-06: 60 mL

## 2018-11-06 MED ORDER — HYDROCODONE-ACETAMINOPHEN 5-325 MG PO TABS: 1.0000 | ORAL_TABLET | ORAL | 0 refills | Status: DC | PRN

## 2018-11-06 MED ORDER — LACTATED RINGERS IV SOLN
INTRAVENOUS | Status: DC
  Administered 2018-11-06 (×2): via INTRAVENOUS

## 2018-11-06 MED ORDER — KETOROLAC TROMETHAMINE 30 MG/ML IJ SOLN
30.0000 mg | Freq: Once | INTRAMUSCULAR | Status: AC
  Administered 2018-11-06: 30 mg via INTRAVENOUS

## 2018-11-06 MED ORDER — FENTANYL CITRATE (PF) 100 MCG/2ML IJ SOLN
25.0000 ug | INTRAMUSCULAR | Status: DC | PRN
  Administered 2018-11-06 (×2): 25 ug via INTRAVENOUS

## 2018-11-06 MED ORDER — DEXAMETHASONE SODIUM PHOSPHATE 10 MG/ML IJ SOLN
INTRAMUSCULAR | Status: AC
  Filled 2018-11-06: qty 1

## 2018-11-06 MED ORDER — MIDAZOLAM HCL 2 MG/2ML IJ SOLN
INTRAMUSCULAR | Status: DC | PRN
  Administered 2018-11-06: 2 mg via INTRAVENOUS

## 2018-11-06 MED ORDER — PROPOFOL 10 MG/ML IV BOLUS
INTRAVENOUS | Status: AC
  Filled 2018-11-06: qty 20

## 2018-11-06 MED ORDER — PROPOFOL 10 MG/ML IV BOLUS
INTRAVENOUS | Status: DC | PRN
  Administered 2018-11-06: 180 mg via INTRAVENOUS

## 2018-11-06 MED ORDER — FAMOTIDINE 20 MG PO TABS
ORAL_TABLET | ORAL | Status: AC
  Administered 2018-11-06: 11:00:00 20 mg via ORAL
  Filled 2018-11-06: qty 1

## 2018-11-06 MED ORDER — ONDANSETRON HCL 4 MG/2ML IJ SOLN: 4.0000 mg | Freq: Once | INTRAMUSCULAR | Status: DC | PRN

## 2018-11-06 MED ORDER — DEXAMETHASONE SODIUM PHOSPHATE 10 MG/ML IJ SOLN
INTRAMUSCULAR | Status: DC | PRN
  Administered 2018-11-06: 10 mg via INTRAVENOUS

## 2018-11-06 MED ORDER — FENTANYL CITRATE (PF) 100 MCG/2ML IJ SOLN
INTRAMUSCULAR | Status: DC | PRN
  Administered 2018-11-06 (×4): 25 ug via INTRAVENOUS

## 2018-11-06 MED ORDER — KETOROLAC TROMETHAMINE 30 MG/ML IJ SOLN
INTRAMUSCULAR | Status: AC
  Filled 2018-11-06: qty 1

## 2018-11-06 MED ORDER — ONDANSETRON HCL 4 MG/2ML IJ SOLN
INTRAMUSCULAR | Status: DC | PRN
  Administered 2018-11-06: 4 mg via INTRAVENOUS

## 2018-11-06 MED ORDER — FAMOTIDINE 20 MG PO TABS
20.0000 mg | ORAL_TABLET | Freq: Once | ORAL | Status: AC
  Administered 2018-11-06: 11:00:00 20 mg via ORAL

## 2018-11-06 MED ORDER — LIDOCAINE HCL (PF) 2 % IJ SOLN
INTRAMUSCULAR | Status: AC
  Filled 2018-11-06: qty 10

## 2018-11-06 MED ORDER — FENTANYL CITRATE (PF) 100 MCG/2ML IJ SOLN
INTRAMUSCULAR | Status: AC
  Administered 2018-11-06: 25 ug via INTRAVENOUS
  Filled 2018-11-06: qty 2

## 2018-11-06 SURGICAL SUPPLY — 18 items
CATH FOL 2WAY LX 16X5 (CATHETERS) ×3 IMPLANT
CATH FOLEY 2W COUNCIL 20FR 5CC (CATHETERS) ×3 IMPLANT
CATH SET URETHRAL DILATOR (CATHETERS) IMPLANT
ELECT REM PT RETURN 9FT ADLT (ELECTROSURGICAL) ×3
ELECTRODE REM PT RTRN 9FT ADLT (ELECTROSURGICAL) ×1 IMPLANT
GLOVE BIO SURGEON STRL SZ8 (GLOVE) ×3 IMPLANT
GOWN STRL REUS W/ TWL XL LVL3 (GOWN DISPOSABLE) ×2 IMPLANT
GOWN STRL REUS W/TWL XL LVL3 (GOWN DISPOSABLE) ×4
HOLDER FOLEY CATH W/STRAP (MISCELLANEOUS) ×3 IMPLANT
KIT BALLN UROMAX 15FX4 (MISCELLANEOUS) ×1 IMPLANT
KIT BALLN UROMAX 26 75X4 (MISCELLANEOUS) ×2
PACK CYSTO AR (MISCELLANEOUS) ×3 IMPLANT
SENSORWIRE 0.038 NOT ANGLED (WIRE) ×3
SET CYSTO W/LG BORE CLAMP LF (SET/KITS/TRAYS/PACK) ×3 IMPLANT
SYR 30ML LL (SYRINGE) ×3 IMPLANT
WATER STERILE IRR 1000ML POUR (IV SOLUTION) ×3 IMPLANT
WATER STERILE IRR 3000ML UROMA (IV SOLUTION) ×3 IMPLANT
WIRE SENSOR 0.038 NOT ANGLED (WIRE) ×1 IMPLANT

## 2018-11-06 NOTE — Anesthesia Preprocedure Evaluation (Signed)
Anesthesia Evaluation  Patient identified by MRN, date of birth, ID band Patient awake    Reviewed: Allergy & Precautions, NPO status , Patient's Chart, lab work & pertinent test results  History of Anesthesia Complications Negative for: history of anesthetic complications  Airway Mallampati: II  TM Distance: >3 FB Neck ROM: Full    Dental no notable dental hx.    Pulmonary neg sleep apnea, neg COPD, Current Smoker and Patient abstained from smoking.,    breath sounds clear to auscultation- rhonchi (-) wheezing      Cardiovascular Exercise Tolerance: Good (-) hypertension(-) CAD, (-) Past MI, (-) Cardiac Stents and (-) CABG  Rhythm:Regular Rate:Normal - Systolic murmurs and - Diastolic murmurs    Neuro/Psych neg Seizures negative neurological ROS  negative psych ROS   GI/Hepatic Neg liver ROS, GERD  ,  Endo/Other  negative endocrine ROSneg diabetes  Renal/GU negative Renal ROS     Musculoskeletal negative musculoskeletal ROS (+)   Abdominal (+) - obese,   Peds  Hematology negative hematology ROS (+)   Anesthesia Other Findings Past Medical History: No date: Benign enlargement of prostate No date: Elevated PSA No date: Epididymitis No date: GERD (gastroesophageal reflux disease)     Comment:  tums prn No date: Hematuria, microscopic No date: History of kidney stones     Comment:  h/o No date: Testicular pain, left No date: Tobacco abuse   Reproductive/Obstetrics                             Anesthesia Physical Anesthesia Plan  ASA: II  Anesthesia Plan: General   Post-op Pain Management:    Induction: Intravenous  PONV Risk Score and Plan: 0 and Ondansetron  Airway Management Planned: LMA  Additional Equipment:   Intra-op Plan:   Post-operative Plan:   Informed Consent: I have reviewed the patients History and Physical, chart, labs and discussed the procedure including  the risks, benefits and alternatives for the proposed anesthesia with the patient or authorized representative who has indicated his/her understanding and acceptance.     Dental advisory given  Plan Discussed with: CRNA and Anesthesiologist  Anesthesia Plan Comments:         Anesthesia Quick Evaluation

## 2018-11-06 NOTE — Anesthesia Postprocedure Evaluation (Signed)
Anesthesia Post Note  Patient: Joel Clay  Procedure(s) Performed: CYSTOSCOPY WITH URETHRAL DILATATION (N/A )  Patient location during evaluation: PACU Anesthesia Type: General Level of consciousness: awake and alert and oriented Pain management: pain level controlled Vital Signs Assessment: post-procedure vital signs reviewed and stable Respiratory status: spontaneous breathing, nonlabored ventilation and respiratory function stable Cardiovascular status: blood pressure returned to baseline and stable Postop Assessment: no signs of nausea or vomiting Anesthetic complications: no     Last Vitals:  Vitals:   11/06/18 1412 11/06/18 1431  BP:  136/83  Pulse:  (!) 56  Resp:  18  Temp: (!) 36.4 C (!) 36 C  SpO2:  100%    Last Pain:  Vitals:   11/06/18 1431  TempSrc: Temporal  PainSc: 5                  Lashara Urey

## 2018-11-06 NOTE — Discharge Instructions (Signed)
Cystoscopy patient instructions  Following a cystoscopy, a catheter (a flexible rubber tube) is sometimes left in place to empty the bladder. This may cause some discomfort or a feeling that you need to urinate. Your doctor determines the period of time that the catheter will be left in place. You may have bloody urine for two to three days (Call your doctor if the amount of bleeding increases or does not subside).  You may pass blood clots in your urine, especially if you had a biopsy. It is not unusual to pass small blood clots and have some bloody urine a couple of weeks after your cystoscopy. Again, call your doctor if the bleeding does not subside. You may have: Dysuria (painful urination) Frequency (urinating often) Urgency (strong desire to urinate)  These symptoms are common especially if medicine is instilled into the bladder or a ureteral stent is placed. Avoiding alcohol and caffeine, such as coffee, tea, and chocolate, may help relieve these symptoms. Drink plenty of water, unless otherwise instructed. Your doctor may also prescribe an antibiotic or other medicine to reduce these symptoms.  Cystoscopy results are available soon after the procedure; biopsy results usually take two to four days. Your doctor will discuss the results of your exam with you. Before you go home, you will be given specific instructions for follow-up care. Special Instructions:  1  If you are going home with a catheter in place do not take a tub bath until removed by your doctor.  2  You may resume your normal activities.  3  Do not drive or operate machinery if you are taking narcotic pain medicine.  4  Be sure to keep all follow-up appointments with your doctor.  You will have an appointment for catheter removal on 11/07/2018  5 Call Your Doctor If: The catheter is not draining  You have severe pain  You are unable to urinate  You have a fever over 101  You have severe bleeding    AMBULATORY SURGERY    DISCHARGE INSTRUCTIONS   1) The drugs that you were given will stay in your system until tomorrow so for the next 24 hours you should not:  A) Drive an automobile B) Make any legal decisions C) Drink any alcoholic beverage   2) You may resume regular meals tomorrow.  Today it is better to start with liquids and gradually work up to solid foods.  You may eat anything you prefer, but it is better to start with liquids, then soup and crackers, and gradually work up to solid foods.   3) Please notify your doctor immediately if you have any unusual bleeding, trouble breathing, redness and pain at the surgery site, drainage, fever, or pain not relieved by medication.    4) Additional Instructions:        Please contact your physician with any problems or Same Day Surgery at [phone], Monday through Friday 6 am to 4 pm, or [place] at [facility] number at [phone].

## 2018-11-06 NOTE — Progress Notes (Signed)
Pt in PACU rates pain 7 out of 10. Fentanyl 67mcg x 2 given with little improvement in pain. Pt states he has multiple bowel movements per day and feels the pain in related to his need to have a BM. Patient refused further pain medication. He requests to ambulate to the restroom in post op to have a BM.

## 2018-11-06 NOTE — Interval H&P Note (Signed)
History and Physical Interval Note:  11/06/2018 12:15 PM  Joel Clay  has presented today for surgery, with the diagnosis of anterior urethral dilation.  The various methods of treatment have been discussed with the patient and family. After consideration of risks, benefits and other options for treatment, the patient has consented to  Procedure(s): CYSTOSCOPY WITH URETHRAL DILATATION (N/A) as a surgical intervention.  The patient's history has been reviewed, patient examined, no change in status, stable for surgery.  I have reviewed the patient's chart and labs.  Questions were answered to the patient's satisfaction.     [place]

## 2018-11-06 NOTE — Anesthesia Post-op Follow-up Note (Signed)
Anesthesia QCDR form completed.        

## 2018-11-06 NOTE — Anesthesia Procedure Notes (Signed)
Procedure Name: LMA Insertion Date/Time: 11/06/2018 12:38 PM Performed by: Jerrye Noble, CRNA Pre-anesthesia Checklist: Patient identified, Emergency Drugs available, Suction available, Patient being monitored and Timeout performed Patient Re-evaluated:Patient Re-evaluated prior to induction Oxygen Delivery Method: Circle system utilized Preoxygenation: Pre-oxygenation with 100% oxygen Induction Type: IV induction Ventilation: Mask ventilation without difficulty LMA: LMA inserted LMA Size: 4.0 Number of attempts: 1

## 2018-11-06 NOTE — Transfer of Care (Signed)
Immediate Anesthesia Transfer of Care Note  Patient: Joel Clay  Procedure(s) Performed: CYSTOSCOPY WITH URETHRAL DILATATION (N/A )  Patient Location: PACU  Anesthesia Type:General  Level of Consciousness: sedated  Airway & Oxygen Therapy: Patient Spontanous Breathing and Patient connected to face mask oxygen  Post-op Assessment: Report given to RN  Post vital signs: Reviewed and stable  Last Vitals:  Vitals Value Taken Time  BP 101/73 11/06/18 1320  Temp    Pulse 61 11/06/18 1324  Resp 14 11/06/18 1324  SpO2 98 % 11/06/18 1324  Vitals shown include unvalidated device data.  Last Pain:  Vitals:   11/06/18 1101  TempSrc: Temporal  PainSc: 0-No pain         Complications: No apparent anesthesia complications

## 2018-11-07 ENCOUNTER — Encounter: Payer: Self-pay | Admitting: Urology

## 2018-11-07 ENCOUNTER — Ambulatory Visit: Payer: 59 | Admitting: *Deleted

## 2018-11-07 DIAGNOSIS — N35914 Unspecified anterior urethral stricture, male: Secondary | ICD-10-CM

## 2018-11-07 NOTE — Progress Notes (Signed)
Catheter Removal  Patient is present today for a catheter removal.  61ml of water was drained from the balloon. A 14FR foley cath was removed from the bladder no complications were noted Patient tolerated well.  Performed by: Verlene Mayer, CMA  Follow up/ Additional notes: 4-6 weeks with pvr

## 2018-11-07 NOTE — Op Note (Signed)
Preoperative diagnosis:  1. Anterior urethral stricture  Postoperative diagnosis:  1. 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 Cystoscopy with balloon dilation of urethral strictures x2  Surgeon: Abbie Sons, MD  Anesthesia: General  Complications: None  Intraoperative findings:  1.  Anterior urethral stricture approximately 12 French 2.  Bulbous urethral stricture approximately 10 French 3.  Nonocclusive prostate 4.  No bladder mucosal abnormalities noted  EBL: Minimal  Specimens: None  Indication: Joel Clay is a 58 y.o. patient with obstructive voiding symptoms.  He did note improvement on tamsulosin however cystoscopy.  After reviewing the management options for treatment, he elected to proceed with the above surgical procedure(s). We have discussed the potential benefits and risks of the procedure, side effects of the proposed treatment, the likelihood of the patient achieving the goals of the procedure, and any potential problems that might occur during the procedure or recuperation. Informed consent has been obtained.  Description of procedure:  The patient was taken to the operating room and general anesthesia was induced.  The patient was placed in the dorsal lithotomy position, prepped and draped in the usual sterile fashion, and preoperative antibiotics were administered. A preoperative time-out was performed.   A 21 French cystoscope was lubricated and passed per urethra.  In the distal portion of the anterior urethra the above stricture was noted.  A 0.038 sensor wire was placed through the cystoscope and through the stricture without difficulty.  A 15 French 4 cm UroMax balloon was placed over the wire and through the stricture and inflated to 20 atm.  No balloon narrowing was noted.  The balloon was deflated and the cystoscope easily advanced through the stricture.  The cystoscope was advanced proximally and the above-mentioned  bulbous urethral stricture was identified.  The guidewire was through the stricture and the balloon was advanced to the stricture and inflated in a similar fashion with good dilation.  The cystoscope was then removed and advanced alongside the wire.  No bleeding was noted.  The prostate was nonocclusive with mild bladder neck elevation.  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  The cystoscope was removed.  A 20 [place] council catheter was placed over the wire without difficulty and inflated with 10 cc of sterile water.  Plan: He will be discharged with an indwelling Foley catheter and follow-up in the office tomorrow morning for removal.   Abbie Sons, M.D.

## 2018-11-12 ENCOUNTER — Telehealth: Payer: Self-pay | Admitting: Family Medicine

## 2018-11-12 DIAGNOSIS — K769 Liver disease, unspecified: Secondary | ICD-10-CM

## 2018-11-12 NOTE — Telephone Encounter (Signed)
Patient would like [place] appointment, please put in order

## 2018-11-12 NOTE — Telephone Encounter (Addendum)
-----   Message from [name], FNP sent at 05/17/2018  7:27 AM EST ----- Regarding: Call patient to have Liver US repeat Call patient - there was a small spot on his liver found on CT Renal study on 05/16/2018 when he was in the ER and a 6 month follow up ultrasound was recommended.  I've placed the order for the [place].  He has not been in for a follow up visit recently, so if he'd like to schedule a follow up and discuss prior to the [place] that's fine.

## 2018-11-12 NOTE — Telephone Encounter (Signed)
As per my last note - it's ordered, he just needs to schedule when they call.

## 2018-12-20 ENCOUNTER — Ambulatory Visit: Payer: 59 | Admitting: Urology

## 2018-12-20 ENCOUNTER — Encounter: Payer: Self-pay | Admitting: Urology

## 2018-12-20 ENCOUNTER — Other Ambulatory Visit: Payer: Self-pay

## 2018-12-20 VITALS — BP 115/74 | HR 70 | Ht 67.0 in | Wt 161.0 lb

## 2018-12-20 DIAGNOSIS — Z9889 Other specified postprocedural states: Secondary | ICD-10-CM

## 2018-12-20 DIAGNOSIS — Z87448 Personal history of other diseases of urinary system: Secondary | ICD-10-CM | POA: Diagnosis not present

## 2018-12-20 NOTE — Progress Notes (Signed)
12/20/2018 10:19 AM   Joel Clay [birth year] [id number]  Referring provider: [name], [place],  [place] [address]  Chief Complaint  Patient presents with  . Routine Post Op    HPI: Joel Clay presents for postop follow-up.  He was initially seen for obstructive voiding symptoms that did not improve on alpha-blocker.  Office cystoscopy was remarkable for an anterior urethral stricture.  He underwent balloon dilation of his urethral stricture on 11/06/2018.  He was also found to have a bulbous urethral stricture which was dilated in a similar fashion.  Prostate was nonocclusive with mild bladder neck elevation.  Foley catheter was removed on postoperative day #1.  He states he is voiding with an excellent stream and all of his lower urinary tract symptoms have resolved.   PMH: Past Medical History:  Diagnosis Date  . Benign enlargement of prostate   . Elevated PSA   . Epididymitis   . GERD (gastroesophageal reflux disease)    tums prn  . Hematuria, microscopic   . History of kidney stones    h/o  . History of urethral stricture   . Testicular pain, left   . Tobacco abuse     Surgical History: Past Surgical History:  Procedure Laterality Date  . CYSTOSCOPY WITH URETHRAL DILATATION N/A 11/06/2018   Procedure: CYSTOSCOPY WITH URETHRAL DILATATION;  Surgeon: Abbie Sons, MD;  Location: [facility] ORS;  Service: Urology;  Laterality: N/A;  . NO PAST SURGERIES      Home Medications:  Allergies as of 12/20/2018   No Known Allergies     Medication List       Accurate as of December 20, 2018 10:19 AM. If you have any questions, ask your nurse or doctor.        STOP taking these medications   HYDROcodone-acetaminophen 5-325 MG tablet Commonly known as: NORCO/VICODIN Stopped by: Abbie Sons, MD     TAKE these medications   calcium carbonate 500 MG chewable tablet Commonly known as: TUMS - dosed in mg elemental calcium Chew 1  tablet by mouth as needed for indigestion or heartburn.   tetrahydrozoline 0.05 % ophthalmic solution Place 1-2 drops into both eyes 3 (three) times daily as needed (dry/red/irritated eyes.).       Allergies: No Known Allergies  Family History: Family History  Problem Relation Age of Onset  . Renal Disease Maternal Grandmother   . Diabetes Maternal Grandmother   . Diabetes Maternal Grandfather   . Cancer Maternal Aunt        Unknown type  . Diabetes Maternal Aunt     Social History:  reports that he has been smoking cigarettes. He has a 5.00 pack-year smoking history. He has never used smokeless tobacco. He reports current alcohol use. He reports that he does not use drugs.  ROS: UROLOGY Frequent Urination?: No Hard to postpone urination?: No Burning/pain with urination?: No Get up at night to urinate?: No Leakage of urine?: No Urine stream starts and stops?: No Trouble starting stream?: No Do you have to strain to urinate?: No Blood in urine?: No Urinary tract infection?: No Sexually transmitted disease?: No Injury to kidneys or bladder?: No Painful intercourse?: No Weak stream?: No Erection problems?: No Penile pain?: No  Gastrointestinal Nausea?: No Vomiting?: No Indigestion/heartburn?: No Diarrhea?: No Constipation?: No  Constitutional Fever: No Night sweats?: No Weight loss?: No Fatigue?: No  Skin Skin rash/lesions?: No Itching?: No  Eyes Blurred vision?: No Double vision?:  No  Ears/Nose/Throat Sore throat?: No Sinus problems?: No  Hematologic/Lymphatic Swollen glands?: No Easy bruising?: No  Cardiovascular Leg swelling?: No Chest pain?: No  Respiratory Cough?: No Shortness of breath?: No  Endocrine Excessive thirst?: No  Musculoskeletal Back pain?: No Joint pain?: No  Neurological Headaches?: No Dizziness?: No  Psychologic Depression?: No Anxiety?: No  Physical Exam: BP 115/74 (BP Location: Left Arm, Patient  Position: Sitting, Cuff Size: Normal)   Pulse 70   Ht 5\' 7"  (1.702 m)   Wt 161 lb (73 kg)   BMI 25.22 kg/m   Constitutional:  Alert and oriented, No acute distress. HEENT: [place] AT, moist mucus membranes.  Trachea midline, no masses. Cardiovascular: No clubbing, cyanosis, or edema. Respiratory: Normal respiratory effort, no increased work of breathing.   Assessment & Plan:   Doing well status post balloon dilation of penile and bulbous urethral strictures.  We did discuss an approximately 25% recurrence rate of endoscopically treated strictures.  Follow-up 6 months for symptom reassessment and bladder scan for PVR.  He was instructed to call earlier for recurrence of obstructive voiding symptoms.   Abbie Sons, [place] [address], [place], [place] [address] [phone]

## 2019-02-14 ENCOUNTER — Other Ambulatory Visit (HOSPITAL_COMMUNITY)
Admission: RE | Admit: 2019-02-14 | Discharge: 2019-02-14 | Disposition: A | Discharge status: Home / Self Care | Payer: 59 | Source: Ambulatory Visit | Attending: Family Medicine | Admitting: Family Medicine

## 2019-02-14 ENCOUNTER — Encounter: Payer: Self-pay | Admitting: Family Medicine

## 2019-02-14 ENCOUNTER — Other Ambulatory Visit: Payer: Self-pay

## 2019-02-14 ENCOUNTER — Ambulatory Visit (INDEPENDENT_AMBULATORY_CARE_PROVIDER_SITE_OTHER): Payer: 59 | Admitting: Family Medicine

## 2019-02-14 VITALS — BP 124/78 | HR 83 | Temp 97.3°F | Resp 16 | Ht 67.0 in | Wt 167.0 lb

## 2019-02-14 DIAGNOSIS — Z125 Encounter for screening for malignant neoplasm of prostate: Secondary | ICD-10-CM

## 2019-02-14 DIAGNOSIS — R739 Hyperglycemia, unspecified: Secondary | ICD-10-CM

## 2019-02-14 DIAGNOSIS — Z1283 Encounter for screening for malignant neoplasm of skin: Secondary | ICD-10-CM

## 2019-02-14 DIAGNOSIS — Z113 Encounter for screening for infections with a predominantly sexual mode of transmission: Secondary | ICD-10-CM | POA: Insufficient documentation

## 2019-02-14 DIAGNOSIS — Z0000 Encounter for general adult medical examination without abnormal findings: Secondary | ICD-10-CM | POA: Diagnosis present

## 2019-02-14 DIAGNOSIS — Z13220 Encounter for screening for lipoid disorders: Secondary | ICD-10-CM | POA: Diagnosis not present

## 2019-02-14 DIAGNOSIS — D2230 Melanocytic nevi of unspecified part of face: Secondary | ICD-10-CM

## 2019-02-14 NOTE — Progress Notes (Signed)
Name: Joel Clay   MRN: [id number]    DOB: [birth year]   Date:02/14/2019       Progress Note  Subjective  Chief Complaint  Chief Complaint  Patient presents with  . Annual Exam    HPI  Patient presents for annual CPE.  USPSTF grade A and B recommendations:  Diet: Balanced Exercise: Walks a lot at work  Depression: phq 9 is negative Depression screen [facility] 2/9 02/14/2019 02/01/2018 03/16/2017 02/16/2017 12/23/2015  Decreased Interest 0 0 0 0 0  Down, Depressed, Hopeless 0 0 0 0 0  PHQ - 2 Score 0 0 0 0 0  Altered sleeping 0 0 - - -  Tired, decreased energy 0 0 - - -  Change in appetite 0 0 - - -  Feeling bad or failure about yourself  0 0 - - -  Trouble concentrating 0 0 - - -  Moving slowly or fidgety/restless 0 0 - - -  Suicidal thoughts 0 0 - - -  PHQ-9 Score 0 0 - - -  Difficult doing work/chores Not difficult at all Not difficult at all - - -    Hypertension:  BP Readings from Last 3 Encounters:  02/14/19 124/78  12/20/18 115/74  11/06/18 135/80    Obesity: Wt Readings from Last 3 Encounters:  02/14/19 167 lb (75.8 kg)  12/20/18 161 lb (73 kg)  10/24/18 161 lb (73 kg)   BMI Readings from Last 3 Encounters:  02/14/19 26.16 kg/m  12/20/18 25.22 kg/m  10/24/18 25.22 kg/m     Lipids:  Lab Results  Component Value Date   CHOL 195 02/01/2018   CHOL 217 (H) 03/16/2017   CHOL 197 03/10/2016   Lab Results  Component Value Date   HDL 52 02/01/2018   HDL 59 03/16/2017   HDL 63 03/10/2016   Lab Results  Component Value Date   LDLCALC 107 (H) 02/01/2018   LDLCALC 129 (H) 03/16/2017   LDLCALC 110 (H) 03/10/2016   Lab Results  Component Value Date   TRIG 240 (H) 02/01/2018   TRIG 176 (H) 03/16/2017   TRIG 122 03/10/2016   Lab Results  Component Value Date   CHOLHDL 3.8 02/01/2018   CHOLHDL 3.7 03/16/2017   CHOLHDL 3.1 03/10/2016   No results found for: LDLDIRECT Glucose:  Glucose  Date Value Ref Range Status  08/06/2013 119 (H) 65 - 99  mg/dL Final   Glucose, Bld  Date Value Ref Range Status  02/01/2018 87 65 - 139 mg/dL Final    Comment:    .        Non-fasting reference interval .   03/16/2017 87 65 - 99 mg/dL Final    Comment:    .            Fasting reference interval .   03/10/2016 107 (H) 65 - 99 mg/dL Final      Office Visit from 02/14/2019 in [facility]  AUDIT-C Score  0    Drinking on the weekends - up to 6 drinks in a day.  Legally Separated STD testing and prevention (HIV/chl/gon/syphilis): Needs STI screening; 1 new partner in the last year Hep C: Negative in 2019.  Skin cancer: Has some moles on his face he would like removed by dermatology.  Discussed monitoring for atypical lesions. Colorectal cancer: Denies family or personal history of colorectal cancer, no changes in BM's - no blood in stool, dark and tarry stool, mucus in stool, or constipation/diarrhea.  UTD on colonoscopy - due in 2025.  He notes that he has had increased belching lately - no abdominal pain, no epigastric pain, no difficulty swallowing/dysphagia, no other GI concerns - he will trial OTC simethicone and some dietary changes first.   Prostate cancer: No family history, does not know Dad's history.  He did have recent urethral stricture that Dr. Bernardo Heater performed balloon dilation and has been doing well since then.  Lab Results  Component Value Date   PSA 0.6 02/01/2018   PSA 0.9 03/16/2017   PSA 2.6 03/10/2016   IPSS Questionnaire (AUA-7): Over the past month.   1)  How often have you had a sensation of not emptying your bladder completely after you finish urinating?  0 - Not at all  2)  How often have you had to urinate again less than two hours after you finished urinating? 0 - Not at all  3)  How often have you found you stopped and started again several times when you urinated?  0 - Not at all  4) How difficult have you found it to postpone urination?  0 - Not at all  5) How often have you had a  weak urinary stream?  0 - Not at all  6) How often have you had to push or strain to begin urination?  0 - Not at all  7) How many times did you most typically get up to urinate from the time you went to bed until the time you got up in the morning?  1 - 1 time  Total score:  0-7 mildly symptomatic   8-19 moderately symptomatic   20-35 severely symptomatic   Lung cancer: Smoking about 1/2 pack daily  Low Dose CT Chest recommended if Age 14-80 years, 30 pack-year currently smoking OR have quit w/in 15years. Patient does not qualify.   AAA: The USPSTF recommends one-time screening with ultrasonography in men ages 85 to 79 years who have ever smoked ECG: Denies chest pain, shortness of breath, or palpitations  Advanced Care Planning: A voluntary discussion about advance care planning including the explanation and discussion of advance directives.  Discussed health care proxy and Living will, and the patient was not able to identify a health care proxy.  Patient does not have a living will at present time. If patient does have living will, I have requested they bring this to the clinic to be scanned in to their chart.  Patient Active Problem List   Diagnosis Date Noted  . Tobacco abuse counseling 02/01/2018  . Mixed hyperlipidemia 02/01/2018  . Mass of right hand 03/16/2017  . Annual physical exam 03/10/2016  . Hyperglycemia 03/10/2016  . Neck pain, chronic 12/23/2015    Past Surgical History:  Procedure Laterality Date  . CYSTOSCOPY WITH URETHRAL DILATATION N/A 11/06/2018   Procedure: CYSTOSCOPY WITH URETHRAL DILATATION;  Surgeon: Abbie Sons, MD;  Location: [facility] ORS;  Service: Urology;  Laterality: N/A;  . NO PAST SURGERIES      Family History  Problem Relation Age of Onset  . Renal Disease Maternal Grandmother   . Diabetes Maternal Grandmother   . Diabetes Maternal Grandfather   . Cancer Maternal Aunt        Unknown type  . Diabetes Maternal Aunt     Social History    Socioeconomic History  . Marital status: Legally Separated    Spouse name: Not on file  . Number of children: 3  . Years of education: Not on file  .  Highest education level: Not on file  Occupational History  . Not on file  Social Needs  . Financial resource strain: Not hard at all  . Food insecurity    Worry: Never true    Inability: Never true  . Transportation needs    Medical: No    Non-medical: No  Tobacco Use  . Smoking status: Current Some Day Smoker    Packs/day: 0.25    Years: 20.00    Pack years: 5.00    Types: Cigarettes  . Smokeless tobacco: Never Used  Substance and Sexual Activity  . Alcohol use: Yes    Alcohol/week: 0.0 standard drinks    Comment: occasional  . Drug use: No  . Sexual activity: Yes    Partners: Female  Lifestyle  . Physical activity    Days per week: 4 days    Minutes per session: 60 min  . Stress: Not at all  Relationships  . Social Herbalist on phone: More than three times a week    Gets together: Never    Attends religious service: Never    Active member of club or organization: No    Attends meetings of clubs or organizations: Never    Relationship status: Divorced  . Intimate partner violence    Fear of current or ex partner: No    Emotionally abused: No    Physically abused: No    Forced sexual activity: No  Other Topics Concern  . Not on file  Social History Narrative   Works for [employer] - travels regularly.     Current Outpatient Medications:  .  tetrahydrozoline 0.05 % ophthalmic solution, Place 1-2 drops into both eyes 3 (three) times daily as needed (dry/red/irritated eyes.). , Disp: , Rfl:  .  calcium carbonate (TUMS - DOSED IN MG ELEMENTAL CALCIUM) 500 MG chewable tablet, Chew 1 tablet by mouth as needed for indigestion or heartburn., Disp: , Rfl:   No Known Allergies   ROS  Constitutional: Negative for fever or weight change.  Respiratory: Negative for cough and  shortness of breath.   Cardiovascular: Negative for chest pain or palpitations.  Gastrointestinal: Negative for abdominal pain, no bowel changes.  Musculoskeletal: Negative for gait problem or joint swelling.  Skin: Negative for rash.  Neurological: Negative for dizziness or headache.  No other specific complaints in a complete review of systems (except as listed in HPI above).   Objective  Vitals:   02/14/19 1250  BP: 124/78  Pulse: 83  Resp: 16  Temp: (!) 97.3 F (36.3 C)  TempSrc: Temporal  SpO2: 98%  Weight: 167 lb (75.8 kg)  Height: 5\' 7"  (1.702 m)    Body mass index is 26.16 kg/m.  Physical Exam  Constitutional: Patient appears well-developed and well-nourished. No distress.  HENT: Head: Normocephalic and atraumatic. Ears: B TMs ok, no erythema or effusion; Nose: Nose normal. Mouth/Throat: Oropharynx is clear and moist. No oropharyngeal exudate.  Eyes: Conjunctivae and EOM are normal. Pupils are equal, round, and reactive to light. No scleral icterus.  Neck: Normal range of motion. Neck supple. No JVD present. No thyromegaly present.  Cardiovascular: Normal rate, regular rhythm and normal heart sounds.  No murmur heard. No BLE edema. Pulmonary/Chest: Effort normal and breath sounds normal. No respiratory distress. Abdominal: Soft. Bowel sounds are normal, no distension. There is no tenderness. no masses MALE GENITALIA: Deferred RECTAL: Deferred Musculoskeletal: Normal range of motion, no joint effusions. No gross deformities Neurological:  he is alert and oriented to person, place, and time. No cranial nerve deficit. Coordination, balance, strength, speech and gait are normal.  Skin: Skin is warm and dry. No rash noted. No erythema.  Psychiatric: Patient has a normal mood and affect. behavior is normal. Judgment and thought content normal.  No results found for this or any previous visit (from the past 2160 hour(s)).  PHQ2/9: Depression screen [facility] 2/9 02/14/2019  02/01/2018 03/16/2017 02/16/2017 12/23/2015  Decreased Interest 0 0 0 0 0  Down, Depressed, Hopeless 0 0 0 0 0  PHQ - 2 Score 0 0 0 0 0  Altered sleeping 0 0 - - -  Tired, decreased energy 0 0 - - -  Change in appetite 0 0 - - -  Feeling bad or failure about yourself  0 0 - - -  Trouble concentrating 0 0 - - -  Moving slowly or fidgety/restless 0 0 - - -  Suicidal thoughts 0 0 - - -  PHQ-9 Score 0 0 - - -  Difficult doing work/chores Not difficult at all Not difficult at all - - -    Fall Risk: Fall Risk  02/14/2019 02/01/2018 03/16/2017 02/16/2017 12/23/2015  Falls in the past year? 0 0 No No No  Number falls in past yr: 0 0 - - -  Injury with Fall? 0 0 - - -  Follow up Falls evaluation completed - - - -    Assessment & Plan  1. Annual physical exam -Prostate cancer screening and PSA options (with potential risks and benefits of testing vs not testing) were discussed along with recent recs/guidelines. -USPSTF grade A and B recommendations reviewed with patient; age-appropriate recommendations, preventive care, screening tests, etc discussed and encouraged; healthy living encouraged; see AVS for patient education given to patient -Discussed importance of 150 minutes of physical activity weekly, eat two servings of fish weekly, eat one serving of tree nuts ( cashews, pistachios, pecans, almonds.Marland Kitchen) every other day, eat 6 servings of fruit/vegetables daily and drink plenty of water and avoid sweet beverages.  - Lipid panel - COMPLETE METABOLIC PANEL WITH GFR - RPR - HIV Antibody (routine testing w rflx) - Urine cytology ancillary only  2. Routine screening for STI (sexually transmitted infection) - RPR - HIV Antibody (routine testing w rflx) - Urine cytology ancillary only  3. Hyperglycemia - COMPLETE METABOLIC PANEL WITH GFR  4. Lipid screening - Lipid panel  5. Skin cancer screening - Ambulatory referral to Dermatology  6. Nevus of face - Ambulatory referral to  Dermatology  7. Prostate cancer screening - PSA

## 2019-02-14 NOTE — Patient Instructions (Addendum)
Preventive Care 40-56 Years Old, Male Preventive care refers to lifestyle choices and visits with your health care provider that can promote health and wellness. This includes:  A yearly physical exam. This is also called an annual well check.  Regular dental and eye exams.  Immunizations.  Screening for certain conditions.  Healthy lifestyle choices, such as eating a healthy diet, getting regular exercise, not using drugs or products that contain nicotine and tobacco, and limiting alcohol use. What can I expect for my preventive care visit? Physical exam Your health care provider will check:  Height and weight. These may be used to calculate body mass index (BMI), which is a measurement that tells if you are at a healthy weight.  Heart rate and blood pressure.  Your skin for abnormal spots. Counseling Your health care provider may ask you questions about:  Alcohol, tobacco, and drug use.  Emotional well-being.  Home and relationship well-being.  Sexual activity.  Eating habits.  Work and work environment. What immunizations do I need?  Influenza (flu) vaccine  This is recommended every year. Tetanus, diphtheria, and pertussis (Tdap) vaccine  You may need a Td booster every 10 years. Varicella (chickenpox) vaccine  You may need this vaccine if you have not already been vaccinated. Zoster (shingles) vaccine  You may need this after age 60. Measles, mumps, and rubella (MMR) vaccine  You may need at least one dose of MMR if you were born in 1957 or later. You may also need a second dose. Pneumococcal conjugate (PCV13) vaccine  You may need this if you have certain conditions and were not previously vaccinated. Pneumococcal polysaccharide (PPSV23) vaccine  You may need one or two doses if you smoke cigarettes or if you have certain conditions. Meningococcal conjugate (MenACWY) vaccine  You may need this if you have certain conditions. Hepatitis A vaccine   You may need this if you have certain conditions or if you travel or work in places where you may be exposed to hepatitis A. Hepatitis B vaccine  You may need this if you have certain conditions or if you travel or work in places where you may be exposed to hepatitis B. Haemophilus influenzae type b (Hib) vaccine  You may need this if you have certain risk factors. Human papillomavirus (HPV) vaccine  If recommended by your health care provider, you may need three doses over 6 months. You may receive vaccines as individual doses or as more than one vaccine together in one shot (combination vaccines). Talk with your health care provider about the risks and benefits of combination vaccines. What tests do I need? Blood tests  Lipid and cholesterol levels. These may be checked every 5 years, or more frequently if you are over 50 years old.  Hepatitis C test.  Hepatitis B test. Screening  Lung cancer screening. You may have this screening every year starting at age 55 if you have a 30-pack-year history of smoking and currently smoke or have quit within the past 15 years.  Prostate cancer screening. Recommendations will vary depending on your family history and other risks.  Colorectal cancer screening. All adults should have this screening starting at age 50 and continuing until age 75. Your health care provider may recommend screening at age 45 if you are at increased risk. You will have tests every 1-10 years, depending on your results and the type of screening test.  Diabetes screening. This is done by checking your blood sugar (glucose) after you have not eaten   for a while (fasting). You may have this done every 1-3 years.  Sexually transmitted disease (STD) testing. Follow these instructions at home: Eating and drinking  Eat a diet that includes fresh fruits and vegetables, whole grains, lean protein, and low-fat dairy products.  Take vitamin and mineral supplements as recommended  by your health care provider.  Do not drink alcohol if your health care provider tells you not to drink.  If you drink alcohol: ? Limit how much you have to 0-2 drinks a day. ? Be aware of how much alcohol is in your drink. In the [place] one drink equals one 12 oz bottle of beer (355 mL), one 5 oz glass of wine (148 mL), or one 1 oz glass of hard liquor (44 mL). Lifestyle  Take daily care of your teeth and gums.  Stay active. Exercise for at least 30 minutes on 5 or more days each week.  Do not use any products that contain nicotine or tobacco, such as cigarettes, e-cigarettes, and chewing tobacco. If you need help quitting, ask your health care provider.  If you are sexually active, practice safe sex. Use a condom or other form of protection to prevent STIs (sexually transmitted infections).  Talk with your health care provider about taking a low-dose aspirin every day starting at age 45. What's next?  Go to your health care provider once a year for a well check visit.  Ask your health care provider how often you should have your eyes and teeth checked.  Stay up to date on all vaccines. This information is not intended to replace advice given to you by your health care provider. Make sure you discuss any questions you have with your health care provider. Document Released: 03/26/2015 Document Revised: 02/21/2018 Document Reviewed: 02/21/2018 Elsevier Patient Education  2020 [place] for Gastroesophageal Reflux Disease, Adult When you have gastroesophageal reflux disease (GERD), the foods you eat and your eating habits are very important. Choosing the right foods can help ease your discomfort. Think about working with a nutrition specialist (dietitian) to help you make good choices. What are tips for following this plan?  Meals  Choose healthy foods that are low in fat, such as fruits, vegetables, whole grains, low-fat dairy products, and lean meat, fish,  and poultry.  Eat small meals often instead of 3 large meals a day. Eat your meals slowly, and in a place where you are relaxed. Avoid bending over or lying down until 2-3 hours after eating.  Avoid eating meals 2-3 hours before bed.  Avoid drinking a lot of liquid with meals.  Cook foods using methods other than frying. Bake, grill, or broil food instead.  Avoid or limit: ? Chocolate. ? Peppermint or spearmint. ? Alcohol. ? Pepper. ? Black and decaffeinated coffee. ? Black and decaffeinated tea. ? Bubbly (carbonated) soft drinks. ? Caffeinated energy drinks and soft drinks.  Limit high-fat foods such as: ? Fatty meat or fried foods. ? Whole milk, cream, butter, or ice cream. ? Nuts and nut butters. ? Pastries, donuts, and sweets made with butter or shortening.  Avoid foods that cause symptoms. These foods may be different for everyone. Common foods that cause symptoms include: ? Tomatoes. ? Oranges, lemons, and limes. ? Peppers. ? Spicy food. ? Onions and garlic. ? Vinegar. Lifestyle  Maintain a healthy weight. Ask your doctor what weight is healthy for you. If you need to lose weight, work with your doctor to do so  safely.  Exercise for at least 30 minutes for 5 or more days each week, or as told by your doctor.  Wear loose-fitting clothes.  Do not smoke. If you need help quitting, ask your doctor.  Sleep with the head of your bed higher than your feet. Use a wedge under the mattress or blocks under the bed frame to raise the head of the bed. Summary  When you have gastroesophageal reflux disease (GERD), food and lifestyle choices are very important in easing your symptoms.  Eat small meals often instead of 3 large meals a day. Eat your meals slowly, and in a place where you are relaxed.  Limit high-fat foods such as fatty meat or fried foods.  Avoid bending over or lying down until 2-3 hours after eating.  Avoid peppermint and spearmint, caffeine, alcohol, and  chocolate. This information is not intended to replace advice given to you by your health care provider. Make sure you discuss any questions you have with your health care provider. Document Released: 08/29/2011 Document Revised: 06/20/2018 Document Reviewed: 04/04/2016 Elsevier Patient Education  2020 Joel Clay 1) Get regular. One of the best ways to train your body to sleep well is to go to bed and get up at more or less the same time every day, even on weekends and days off! This regular rhythm will make you feel better and will give your body something to work from. 2) Sleep when sleepy. Only try to sleep when you actually feel tired or sleepy, rather than spending too much time awake in bed. 3) Get up & try again. If you haven't been able to get to sleep after about 20 minutes or more, get up and do something calming or boring until you feel sleepy, then return to bed and try again. Sit quietly on the couch with the lights off (bright light will tell your brain that it is time to wake up), or read something boring like the phone book. Avoid doing anything that is too stimulating or interesting, as this will wake you up even more. 4) Avoid caffeine & nicotine. It is best to avoid consuming any caffeine (in coffee, tea, cola drinks, chocolate, and some medications) or nicotine (cigarettes) for at least 4-6 hours before going to bed. These substances act as stimulants and interfere with the ability to fall asleep 5) Avoid alcohol. It is also best to avoid alcohol for at least 4-6 hours before going to bed. Many people believe that alcohol is relaxing and helps them to get to sleep at first, but it actually interrupts the quality of sleep. 6) Bed is for sleeping. Try not to use your bed for anything other than sleeping and sex, so that your body comes to associate bed with sleep. If you use bed as a place to watch TV, eat, read, work on your laptop, pay  bills, and other things, your body will not learn this Connection. 7) No naps. It is best to avoid taking naps during the day, to make sure that you are tired at bedtime. If you can't make it through the day without a nap, make sure it is for less than an hour and before 3pm. 8) Sleep rituals. You can develop your own rituals of things to remind your body that it is time to sleep - some people find it useful to do relaxing stretches or breathing exercises for 15 minutes before bed each night, or sit calmly with a cup of  caffeine-free tea. 9) Bathtime. Having a hot bath 1-2 hours before bedtime can be useful, as it will raise your body temperature, causing you to feel sleepy as your body temperature drops again. Research shows that sleepiness is associated with a drop in body temperature. 10) No clock-watching. Many people who struggle with sleep tend to watch the clock too much. Frequently checking the clock during the night can wake you up (especially if you turn on the light to read the time) and reinforces negative thoughts such as "Oh no, look how late it is, I'll never get to sleep" or "it's so early, I have only slept for 5 hours, this is terrible." 11) Use a sleep diary. This worksheet can be a useful way of making sure you have the right facts about your sleep, rather than making assumptions. Because a diary involves watching the clock (see point 10) it is a good idea to only use it for two weeks to get an idea of what is going and then perhaps two months down the track to see how you are progressing. 12) Exercise. Regular exercise is a good idea to help with good sleep, but try not to do strenuous exercise in the 4 hours before bedtime. Morning walks are a great way to start the day feeling refreshed! 13) Eat right. A healthy, balanced diet will help you to sleep well, but timing is important. Some people find that a very empty stomach at bedtime is distracting, so it  can be useful to have a light snack, but a heavy meal soon before bed can also interrupt sleep. Some people recommend a warm glass of milk, which contains tryptophan, which acts as a natural sleep inducer. 14) The right space. It is very important that your bed and bedroom are quiet and comfortable for sleeping. A cooler room with enough blankets to stay warm is best, and make sure you have curtains or an eyemask to block out early morning light and earplugs if there is noise outside your room. 15) Keep daytime routine the same. Even if you have a bad night sleep and are tired it is important that you try to keep your daytime activities the same as you had planned. That is, don't avoid activities because you feel tired. This can reinforce the insomnia.

## 2019-02-17 ENCOUNTER — Other Ambulatory Visit: Payer: Self-pay | Admitting: Family Medicine

## 2019-02-17 DIAGNOSIS — Z125 Encounter for screening for malignant neoplasm of prostate: Secondary | ICD-10-CM

## 2019-02-17 DIAGNOSIS — R739 Hyperglycemia, unspecified: Secondary | ICD-10-CM

## 2019-02-17 LAB — URINE CYTOLOGY ANCILLARY ONLY
Chlamydia: NEGATIVE
Comment: NEGATIVE
Comment: NORMAL
Neisseria Gonorrhea: NEGATIVE

## 2019-02-20 LAB — COMPLETE METABOLIC PANEL WITH GFR
AG Ratio: 1.7 (calc) (ref 1.0–2.5)
ALT: 27 U/L (ref 9–46)
AST: 19 U/L (ref 10–35)
Albumin: 4.2 g/dL (ref 3.6–5.1)
Alkaline phosphatase (APISO): 56 U/L (ref 35–144)
BUN: 16 mg/dL (ref 7–25)
CO2: 25 mmol/L (ref 20–32)
Calcium: 9.6 mg/dL (ref 8.6–10.3)
Chloride: 104 mmol/L (ref 98–110)
Creat: 1.05 mg/dL (ref 0.70–1.33)
GFR, Est African American: 92 mL/min/{1.73_m2} (ref >=60)
GFR, Est Non African American: 79 mL/min/{1.73_m2} (ref >=60)
Globulin: 2.5 g/dL (calc) (ref 1.9–3.7)
Glucose, Bld: 144 mg/dL — ABNORMAL HIGH (ref 65–99)
Potassium: 4.4 mmol/L (ref 3.5–5.3)
Sodium: 139 mmol/L (ref 135–146)
Total Bilirubin: 0.4 mg/dL (ref 0.2–1.2)
Total Protein: 6.7 g/dL (ref 6.1–8.1)

## 2019-02-20 LAB — HIV ANTIBODY (ROUTINE TESTING W REFLEX): HIV 1&2 Ab, 4th Generation: NONREACTIVE

## 2019-02-20 LAB — LIPID PANEL
Cholesterol: 227 mg/dL — ABNORMAL HIGH (ref <200)
HDL: 59 mg/dL (ref >=40)
LDL Cholesterol (Calc): 148 mg/dL (calc) — ABNORMAL HIGH
Non-HDL Cholesterol (Calc): 168 mg/dL (calc) — ABNORMAL HIGH (ref <130)
Total CHOL/HDL Ratio: 3.8 (calc) (ref <5.0)
Triglycerides: 96 mg/dL (ref <150)

## 2019-02-20 LAB — PSA: PSA: 1 ng/mL (ref <=4.0)

## 2019-02-20 LAB — TEST AUTHORIZATION

## 2019-02-20 LAB — RPR: RPR Ser Ql: NONREACTIVE

## 2019-02-20 LAB — HEMOGLOBIN A1C W/OUT EAG

## 2019-06-20 ENCOUNTER — Ambulatory Visit: Payer: 59 | Admitting: Urology

## 2019-06-29 NOTE — Progress Notes (Incomplete)
   06/30/19 1:14 PM   [name] [birth year] [id number]  Referring provider: [name], [place],  [place] [address]  No chief complaint on file.   HPI: Joel Clay is a 59 y.o. M who returns today s/p balloon dilation of penile and bulbous urethral strictures on 11/06/2018 for 6 month f/u.   -     1. ***  *** 2. *** *** 3. *** ***     PMH: Past Medical History:  Diagnosis Date  . Benign enlargement of prostate   . Elevated PSA   . Epididymitis   . GERD (gastroesophageal reflux disease)    tums prn  . Hematuria, microscopic   . History of kidney stones    h/o  . History of urethral stricture   . Testicular pain, left   . Tobacco abuse     Surgical History: Past Surgical History:  Procedure Laterality Date  . CYSTOSCOPY WITH URETHRAL DILATATION N/A 11/06/2018   Procedure: CYSTOSCOPY WITH URETHRAL DILATATION;  Surgeon: Abbie Sons, MD;  Location: [facility] ORS;  Service: Urology;  Laterality: N/A;  . NO PAST SURGERIES      Home Medications:  Allergies as of 06/30/2019   No Known Allergies     Medication List       Accurate as of June 29, 2019  1:14 PM. If you have any questions, ask your nurse or doctor.        calcium carbonate 500 MG chewable tablet Commonly known as: TUMS - dosed in mg elemental calcium Chew 1 tablet by mouth as needed for indigestion or heartburn.   tetrahydrozoline 0.05 % ophthalmic solution Place 1-2 drops into both eyes 3 (three) times daily as needed (dry/red/irritated eyes.).       Allergies: No Known Allergies  Family History: Family History  Problem Relation Age of Onset  . Renal Disease Maternal Grandmother   . Diabetes Maternal Grandmother   . Diabetes Maternal Grandfather   . Cancer Maternal Aunt        Unknown type  . Diabetes Maternal Aunt     Social History:  reports that he has been smoking cigarettes. He has a 19.00 pack-year smoking history. He has never used  smokeless tobacco. He reports current alcohol use. He reports that he does not use drugs.   Physical Exam: There were no vitals taken for this visit.  Constitutional:  Alert and oriented, No acute distress. HEENT: [place] AT, moist mucus membranes.  Trachea midline, no masses. Cardiovascular: No clubbing, cyanosis, or edema. Respiratory: Normal respiratory effort, no increased work of breathing. GI: Abdomen is soft, nontender, nondistended, no abdominal masses GU: No CVA tenderness Lymph: No cervical or inguinal lymphadenopathy. Skin: No rashes, bruises or suspicious lesions. Neurologic: Grossly intact, no focal deficits, moving all 4 extremities. Psychiatric: Normal mood and affect.  Laboratory Data:  Lab Results  Component Value Date   CREATININE 1.05 02/14/2019    Urinalysis  Pertinent Imaging: ***  Assessment & Plan:    @DIAGMED @  No follow-ups on file.  [facility] [address] [place], [place] [address] [phone]  I, Lucas Mallow, am acting as a scribe for Dr. Nicki Reaper C. Stoioff,  {Add Media planner

## 2019-06-30 ENCOUNTER — Encounter: Payer: Self-pay | Admitting: Urology

## 2019-06-30 ENCOUNTER — Ambulatory Visit: Payer: 59 | Admitting: Urology

## 2020-02-20 ENCOUNTER — Encounter: Payer: 59 | Admitting: Family Medicine

## 2020-04-09 ENCOUNTER — Ambulatory Visit (INDEPENDENT_AMBULATORY_CARE_PROVIDER_SITE_OTHER): Payer: 59 | Admitting: Family Medicine

## 2020-04-09 ENCOUNTER — Other Ambulatory Visit: Payer: Self-pay

## 2020-04-09 ENCOUNTER — Encounter: Payer: Self-pay | Admitting: Family Medicine

## 2020-04-09 VITALS — BP 128/82 | HR 95 | Temp 98.0°F | Resp 14 | Ht 67.0 in | Wt 172.0 lb

## 2020-04-09 DIAGNOSIS — E782 Mixed hyperlipidemia: Secondary | ICD-10-CM

## 2020-04-09 DIAGNOSIS — R739 Hyperglycemia, unspecified: Secondary | ICD-10-CM | POA: Diagnosis not present

## 2020-04-09 DIAGNOSIS — Z0000 Encounter for general adult medical examination without abnormal findings: Secondary | ICD-10-CM

## 2020-04-09 DIAGNOSIS — Z716 Tobacco abuse counseling: Secondary | ICD-10-CM

## 2020-04-09 DIAGNOSIS — F17200 Nicotine dependence, unspecified, uncomplicated: Secondary | ICD-10-CM

## 2020-04-09 DIAGNOSIS — L989 Disorder of the skin and subcutaneous tissue, unspecified: Secondary | ICD-10-CM

## 2020-04-09 DIAGNOSIS — M25561 Pain in right knee: Secondary | ICD-10-CM

## 2020-04-09 DIAGNOSIS — Z125 Encounter for screening for malignant neoplasm of prostate: Secondary | ICD-10-CM | POA: Diagnosis not present

## 2020-04-09 NOTE — Patient Instructions (Addendum)
Lab Results  Component Value Date   CHOL 227 (H) 02/14/2019   CHOL 195 02/01/2018   CHOL 217 (H) 03/16/2017   Lab Results  Component Value Date   HDL 59 02/14/2019   HDL 52 02/01/2018   HDL 59 03/16/2017   Lab Results  Component Value Date   LDLCALC 148 (H) 02/14/2019   LDLCALC 107 (H) 02/01/2018   LDLCALC 129 (H) 03/16/2017   Lab Results  Component Value Date   TRIG 96 02/14/2019   TRIG 240 (H) 02/01/2018   TRIG 176 (H) 03/16/2017   Lab Results  Component Value Date   CHOLHDL 3.8 02/14/2019   CHOLHDL 3.8 02/01/2018   CHOLHDL 3.7 03/16/2017   The 10-year ASCVD risk score Mikey Bussing DC Jr., et al., 2013) is: 12.1%   Values used to calculate the score:     Age: 60 years     Sex: Male     Is Non-Hispanic African American: Yes     Diabetic: No     Tobacco smoker: Yes     Systolic Blood Pressure: 427 mmHg     Is BP treated: No     HDL Cholesterol: 59 mg/dL     Total Cholesterol: 227 mg/dL  I recommend you take a statin to reduce cholesterol and reduce risk of heart attack and stroke.   Recommendations on cholesterol and starting statins.    There is a benefit from LDL-C (bad cholesterol) lowering with statin therapy at virtually all levels of cardiovascular risk.  If statin therapy had no side effects and caused no financial burden, it might be reasonable to recommend it to virtually all at-risk individuals, similar to a healthy diet and exercise  It is this good of a medication!!  It reduces risk in almost everyone.   There are possible side effects with ALL medications and with statins there is a small subset of the population who may not metabolize it well, which causing muscle aches as a side effect (~5%).  We monitor for this, can test for this, and usually are careful to work with you to get a medication that gives you the benefits with minimal side effects.  Some people are sensitive to medications in general and we try to use the highest dose tolerated to give the most  benefit.   [place] [facility] cholesterol and statin guidelines are as follows: In adults 52 to 58 years of age without diabetes mellitus and with LDL-C levels ?56, at a 10-year atherosclerotic cardiovascular disease risk of ?7.5 percent, start a moderate-intensity statin if a discussion of treatment options favors statin therapy  If LDL is >160, statins are indicated.  Patients with other significant risk factors would also benefit from statin.  Some of these factors include a family history of premature cardiovascular disease, chronic kidney disease, or chronic inflammatory disorder (such as chronic human immunodeficiency viral infection).   Can get more information at the following website:  [employer].co.za       Preventive Care 11-68 Years Old, Male Preventive care refers to lifestyle choices and visits with your health care provider that can promote health and wellness. This includes:  A yearly physical exam. This is also called an annual wellness visit.  Regular dental and eye exams.  Immunizations.  Screening for certain conditions.  Healthy lifestyle choices, such as: ? Eating a healthy diet. ? Getting regular exercise. ? Not using drugs or products that contain nicotine and tobacco. ? Limiting alcohol use. What can  I expect for my preventive care visit? Physical exam Your health care provider will check your:  Height and weight. These may be used to calculate your BMI (body mass index). BMI is a measurement that tells if you are at a healthy weight.  Heart rate and blood pressure.  Body temperature.  Skin for abnormal spots. Counseling Your health care provider may ask you questions about your:  Past medical problems.  Family's medical history.  Alcohol, tobacco, and drug use.  Emotional well-being.  Home life and relationship  well-being.  Sexual activity.  Diet, exercise, and sleep habits.  Work and work Statistician.  Access to firearms. What immunizations do I need? Vaccines are usually given at various ages, according to a schedule. Your health care provider will recommend vaccines for you based on your age, medical history, and lifestyle or other factors, such as travel or where you work.   What tests do I need? Blood tests  Lipid and cholesterol levels. These may be checked every 5 years, or more often if you are over 40 years old.  Hepatitis C test.  Hepatitis B test. Screening  Lung cancer screening. You may have this screening every year starting at age 26 if you have a 30-pack-year history of smoking and currently smoke or have quit within the past 15 years.  Prostate cancer screening. Recommendations will vary depending on your family history and other risks.  Genital exam to check for testicular cancer or hernias.  Colorectal cancer screening. ? All adults should have this screening starting at age 65 and continuing until age 34. ? Your health care provider may recommend screening at age 18 if you are at increased risk. ? You will have tests every 1-10 years, depending on your results and the type of screening test.  Diabetes screening. ? This is done by checking your blood sugar (glucose) after you have not eaten for a while (fasting). ? You may have this done every 1-3 years.  STD (sexually transmitted disease) testing, if you are at risk. Follow these instructions at home: Eating and drinking  Eat a diet that includes fresh fruits and vegetables, whole grains, lean protein, and low-fat dairy products.  Take vitamin and mineral supplements as recommended by your health care provider.  Do not drink alcohol if your health care provider tells you not to drink.  If you drink alcohol: ? Limit how much you have to 0-2 drinks a day. ? Be aware of how much alcohol is in your drink. In  the [place] one drink equals one 12 oz bottle of beer (355 mL), one 5 oz glass of wine (148 mL), or one 1 oz glass of hard liquor (44 mL).   Lifestyle  Take daily care of your teeth and gums. Brush your teeth every morning and night with fluoride toothpaste. Floss one time each day.  Stay active. Exercise for at least 30 minutes 5 or more days each week.  Do not use any products that contain nicotine or tobacco, such as cigarettes, e-cigarettes, and chewing tobacco. If you need help quitting, ask your health care provider.  Do not use drugs.  If you are sexually active, practice safe sex. Use a condom or other form of protection to prevent STIs (sexually transmitted infections).  If told by your health care provider, take low-dose aspirin daily starting at age 41.  Find healthy ways to cope with stress, such as: ? Meditation, yoga, or listening to music. ? Journaling. ? Talking to  a trusted person. ? Spending time with friends and family. Safety  Always wear your seat belt while driving or riding in a vehicle.  Do not drive: ? If you have been drinking alcohol. Do not ride with someone who has been drinking. ? When you are tired or distracted. ? While texting.  Wear a helmet and other protective equipment during sports activities.  If you have firearms in your house, make sure you follow all gun safety procedures. What's next?  Go to your health care provider once a year for an annual wellness visit.  Ask your health care provider how often you should have your eyes and teeth checked.  Stay up to date on all vaccines. This information is not intended to replace advice given to you by your health care provider. Make sure you discuss any questions you have with your health care provider. Document Revised: 11/26/2018 Document Reviewed: 02/21/2018 Elsevier Patient Education  2021 [employer].
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
## 2020-04-10 LAB — CBC WITH DIFFERENTIAL/PLATELET
Absolute Monocytes: 593 cells/uL (ref 200–950)
Basophils Absolute: 79 cells/uL (ref 0–200)
Basophils Relative: 1 %
Eosinophils Absolute: 213 cells/uL (ref 15–500)
Eosinophils Relative: 2.7 %
HCT: 44.6 % (ref 38.5–50.0)
Hemoglobin: 14.8 g/dL (ref 13.2–17.1)
Lymphs Abs: 2528 cells/uL (ref 850–3900)
MCH: 29.3 pg (ref 27.0–33.0)
MCHC: 33.2 g/dL (ref 32.0–36.0)
MCV: 88.3 fL (ref 80.0–100.0)
MPV: 11.4 fL (ref 7.5–12.5)
Monocytes Relative: 7.5 %
Neutro Abs: 4487 cells/uL (ref 1500–7800)
Neutrophils Relative %: 56.8 %
Platelets: 268 10*3/uL (ref 140–400)
RBC: 5.05 10*6/uL (ref 4.20–5.80)
RDW: 13.6 % (ref 11.0–15.0)
Total Lymphocyte: 32 %
WBC: 7.9 10*3/uL (ref 3.8–10.8)

## 2020-04-10 LAB — LIPID PANEL
Cholesterol: 220 mg/dL — ABNORMAL HIGH (ref <200)
HDL: 55 mg/dL (ref >=40)
LDL Cholesterol (Calc): 139 mg/dL (calc) — ABNORMAL HIGH
Non-HDL Cholesterol (Calc): 165 mg/dL (calc) — ABNORMAL HIGH (ref <130)
Total CHOL/HDL Ratio: 4 (calc) (ref <5.0)
Triglycerides: 134 mg/dL (ref <150)

## 2020-04-10 LAB — COMPLETE METABOLIC PANEL WITH GFR
AG Ratio: 1.8 (calc) (ref 1.0–2.5)
ALT: 34 U/L (ref 9–46)
AST: 23 U/L (ref 10–35)
Albumin: 4.4 g/dL (ref 3.6–5.1)
Alkaline phosphatase (APISO): 56 U/L (ref 35–144)
BUN: 20 mg/dL (ref 7–25)
CO2: 28 mmol/L (ref 20–32)
Calcium: 10 mg/dL (ref 8.6–10.3)
Chloride: 103 mmol/L (ref 98–110)
Creat: 1.05 mg/dL (ref 0.70–1.33)
GFR, Est African American: 91 mL/min/{1.73_m2} (ref >=60)
GFR, Est Non African American: 78 mL/min/{1.73_m2} (ref >=60)
Globulin: 2.4 g/dL (calc) (ref 1.9–3.7)
Glucose, Bld: 84 mg/dL (ref 65–99)
Potassium: 4.3 mmol/L (ref 3.5–5.3)
Sodium: 139 mmol/L (ref 135–146)
Total Bilirubin: 0.4 mg/dL (ref 0.2–1.2)
Total Protein: 6.8 g/dL (ref 6.1–8.1)

## 2020-04-10 LAB — PSA: PSA: 0.72 ng/mL (ref <=4.0)

## 2020-04-10 LAB — HEMOGLOBIN A1C
Hgb A1c MFr Bld: 5.7 % of total Hgb — ABNORMAL HIGH (ref <5.7)
Mean Plasma Glucose: 117 mg/dL
eAG (mmol/L): 6.5 mmol/L

## 2020-04-12 ENCOUNTER — Other Ambulatory Visit: Payer: Self-pay | Admitting: Family Medicine

## 2020-04-12 MED ORDER — ATORVASTATIN CALCIUM 20 MG PO TABS: 20.0000 mg | ORAL_TABLET | Freq: Every day | ORAL | 3 refills | Status: DC

## 2020-04-21 ENCOUNTER — Telehealth: Payer: Self-pay | Admitting: *Deleted

## 2020-04-21 DIAGNOSIS — Z122 Encounter for screening for malignant neoplasm of respiratory organs: Secondary | ICD-10-CM

## 2020-04-21 DIAGNOSIS — F17200 Nicotine dependence, unspecified, uncomplicated: Secondary | ICD-10-CM

## 2020-04-21 DIAGNOSIS — Z87891 Personal history of nicotine dependence: Secondary | ICD-10-CM

## 2020-04-21 NOTE — Telephone Encounter (Signed)
Received referral for initial lung cancer screening scan. Contacted patient and obtained smoking history,(current, 20 pack year) as well as answering questions related to screening process. Patient denies signs of lung cancer such as weight loss or hemoptysis. Patient denies comorbidity that would prevent curative treatment if lung cancer were found. Patient is scheduled for shared decision making visit and CT scan on 05/07/20.

## 2020-05-07 ENCOUNTER — Ambulatory Visit
Admission: RE | Admit: 2020-05-07 | Discharge: 2020-05-07 | Disposition: A | Discharge status: Home / Self Care | Payer: 59 | Source: Ambulatory Visit | Attending: Oncology | Admitting: Oncology

## 2020-05-07 ENCOUNTER — Inpatient Hospital Stay: Payer: 59 | Attending: Oncology | Admitting: Oncology

## 2020-05-07 ENCOUNTER — Other Ambulatory Visit: Payer: Self-pay

## 2020-05-07 DIAGNOSIS — F17210 Nicotine dependence, cigarettes, uncomplicated: Secondary | ICD-10-CM | POA: Diagnosis not present

## 2020-05-07 DIAGNOSIS — F17200 Nicotine dependence, unspecified, uncomplicated: Secondary | ICD-10-CM | POA: Diagnosis present

## 2020-05-07 DIAGNOSIS — Z122 Encounter for screening for malignant neoplasm of respiratory organs: Secondary | ICD-10-CM | POA: Insufficient documentation

## 2020-05-07 DIAGNOSIS — Z87891 Personal history of nicotine dependence: Secondary | ICD-10-CM | POA: Insufficient documentation

## 2020-05-07 NOTE — Progress Notes (Signed)
Virtual Visit via Video Note  I connected with Joel Clay on 05/07/20 at 11:00 AM EST by a video enabled telemedicine application and verified that I am speaking with the correct person using two identifiers.  Location: Patient: Home Provider: Clinic    I discussed the limitations of evaluation and management by telemedicine and the availability of in person appointments. The patient expressed understanding and agreed to proceed.  I discussed the assessment and treatment plan with the patient. The patient was provided an opportunity to ask questions and all were answered. The patient agreed with the plan and demonstrated an understanding of the instructions.   The patient was advised to call back or seek an in-person evaluation if the symptoms worsen or if the condition fails to improve as anticipated.   In accordance with CMS guidelines, patient has met eligibility criteria including age, absence of signs or symptoms of lung cancer.  Social History   Tobacco Use  . Smoking status: Current Every Day Smoker    Packs/day: 0.50    Years: 40.00    Pack years: 20.00    Types: Cigarettes  . Smokeless tobacco: Never Used  . Tobacco comment: referred   Vaping Use  . Vaping Use: Never used  Substance Use Topics  . Alcohol use: Yes    Alcohol/week: 0.0 standard drinks    Comment: occasional  . Drug use: No      A shared decision-making session was conducted prior to the performance of CT scan. This includes one or more decision aids, includes benefits and harms of screening, follow-up diagnostic testing, over-diagnosis, false positive rate, and total radiation exposure.   Counseling on the importance of adherence to annual lung cancer LDCT screening, impact of co-morbidities, and ability or willingness to undergo diagnosis and treatment is imperative for compliance of the program.   Counseling on the importance of continued smoking cessation for former smokers; the importance of smoking  cessation for current smokers, and information about tobacco cessation interventions have been given to patient including [place] and [employer] quit [place] programs.   Written order for lung cancer screening with LDCT has been given to the patient and any and all questions have been answered to the best of my abilities.    Yearly follow up will be coordinated by Burgess Estelle, Thoracic Navigator.  I provided 15 minutes of non face-to-face telephone visit time during this encounter, and > 50% was spent counseling as documented under my assessment & plan.   Jacquelin Hawking, NP

## 2020-05-18 ENCOUNTER — Encounter: Payer: Self-pay | Admitting: *Deleted

## 2020-09-30 ENCOUNTER — Ambulatory Visit: Payer: 59 | Admitting: Dermatology

## 2020-10-08 ENCOUNTER — Ambulatory Visit: Payer: 59 | Admitting: Family Medicine

## 2021-05-20 ENCOUNTER — Ambulatory Visit (INDEPENDENT_AMBULATORY_CARE_PROVIDER_SITE_OTHER): Payer: 59 | Admitting: Nurse Practitioner

## 2021-05-20 ENCOUNTER — Encounter: Payer: Self-pay | Admitting: Nurse Practitioner

## 2021-05-20 ENCOUNTER — Other Ambulatory Visit: Payer: Self-pay | Admitting: Nurse Practitioner

## 2021-05-20 ENCOUNTER — Other Ambulatory Visit: Payer: Self-pay

## 2021-05-20 VITALS — BP 118/78 | HR 81 | Temp 98.0°F | Resp 18 | Ht 67.0 in | Wt 165.8 lb

## 2021-05-20 DIAGNOSIS — Z0000 Encounter for general adult medical examination without abnormal findings: Secondary | ICD-10-CM | POA: Diagnosis not present

## 2021-05-20 DIAGNOSIS — G8929 Other chronic pain: Secondary | ICD-10-CM

## 2021-05-20 DIAGNOSIS — E782 Mixed hyperlipidemia: Secondary | ICD-10-CM | POA: Diagnosis not present

## 2021-05-20 DIAGNOSIS — Z125 Encounter for screening for malignant neoplasm of prostate: Secondary | ICD-10-CM

## 2021-05-20 DIAGNOSIS — Z23 Encounter for immunization: Secondary | ICD-10-CM | POA: Diagnosis not present

## 2021-05-20 DIAGNOSIS — R739 Hyperglycemia, unspecified: Secondary | ICD-10-CM

## 2021-05-20 DIAGNOSIS — F17200 Nicotine dependence, unspecified, uncomplicated: Secondary | ICD-10-CM

## 2021-05-20 DIAGNOSIS — Z131 Encounter for screening for diabetes mellitus: Secondary | ICD-10-CM

## 2021-05-20 MED ORDER — IBUPROFEN 600 MG PO TABS: 600.0000 mg | ORAL_TABLET | Freq: Three times a day (TID) | ORAL | 1 refills | Status: DC | PRN

## 2021-05-20 MED ORDER — NICOTINE 21 MG/24HR TD PT24: 21.0000 mg | MEDICATED_PATCH | Freq: Every day | TRANSDERMAL | 0 refills | Status: DC

## 2021-05-20 NOTE — Progress Notes (Signed)
Name: Stran Raper   MRN: [id number]    DOB: Dec 04, 1960   Date:05/20/2021       Progress Note  Subjective  Chief Complaint  Chief Complaint  Patient presents with   Annual Exam    HPI  Patient presents for annual CPE .   IPSS     Row Name 05/20/21 1304         International Prostate Symptom Score   How often have you had the sensation of not emptying your bladder? Not at All     How often have you had to urinate less than every two hours? Not at All     How often have you found you stopped and started again several times when you urinated? Not at All     How often have you found it difficult to postpone urination? Not at All     How often have you had a weak urinary stream? Not at All     How often have you had to strain to start urination? Not at All     How many times did you typically get up at night to urinate? None     Total IPSS Score 0       Quality of Life due to urinary symptoms   If you were to spend the rest of your life with your urinary condition just the way it is now how would you feel about that? Delighted              Diet: he says he eats a well balanced diet Exercise: he says he has a physical job but does not work out , discussed recommendation of 150 min of physical activity  Sleep: he says he sleeps okay, unsure how many hours  Depression: phq 9 is negative Depression screen [facility] 2/9 05/20/2021 04/09/2020 02/14/2019 02/01/2018 03/16/2017  Decreased Interest 0 0 0 0 0  Down, Depressed, Hopeless 0 0 0 0 0  PHQ - 2 Score 0 0 0 0 0  Altered sleeping 0 0 0 0 -  Tired, decreased energy 0 0 0 0 -  Change in appetite 0 0 0 0 -  Feeling bad or failure about yourself  0 0 0 0 -  Trouble concentrating 0 0 0 0 -  Moving slowly or fidgety/restless 0 0 0 0 -  Suicidal thoughts 0 0 0 0 -  PHQ-9 Score 0 0 0 0 -  Difficult doing work/chores Not difficult at all Not difficult at all Not difficult at all Not difficult at all -    Hypertension:  BP Readings  from Last 3 Encounters:  05/20/21 118/78  04/09/20 128/82  02/14/19 124/78    Obesity: Wt Readings from Last 3 Encounters:  05/20/21 165 lb 12.8 oz (75.2 kg)  05/07/20 172 lb (78 kg)  04/09/20 172 lb (78 kg)   BMI Readings from Last 3 Encounters:  05/20/21 25.97 kg/m  05/07/20 26.94 kg/m  04/09/20 26.94 kg/m     Lipids:  Lab Results  Component Value Date   CHOL 220 (H) 04/09/2020   CHOL 227 (H) 02/14/2019   CHOL 195 02/01/2018   Lab Results  Component Value Date   HDL 55 04/09/2020   HDL 59 02/14/2019   HDL 52 02/01/2018   Lab Results  Component Value Date   LDLCALC 139 (H) 04/09/2020   LDLCALC 148 (H) 02/14/2019   LDLCALC 107 (H) 02/01/2018   Lab Results  Component Value Date   TRIG 134  04/09/2020   TRIG 96 02/14/2019   TRIG 240 (H) 02/01/2018   Lab Results  Component Value Date   CHOLHDL 4.0 04/09/2020   CHOLHDL 3.8 02/14/2019   CHOLHDL 3.8 02/01/2018   No results found for: LDLDIRECT Glucose:  Glucose  Date Value Ref Range Status  08/06/2013 119 (H) 65 - 99 mg/dL Final   Glucose, Bld  Date Value Ref Range Status  04/09/2020 84 65 - 99 mg/dL Final    Comment:    .            Fasting reference interval .   02/14/2019 144 (H) 65 - 99 mg/dL Final    Comment:    .            Fasting reference interval . For someone without known diabetes, a glucose value >125 mg/dL indicates that they may have diabetes and this should be confirmed with a follow-up test. .   02/01/2018 87 65 - 139 mg/dL Final    Comment:    .        Non-fasting reference interval .     [place] Office Visit from 05/20/2021 in [facility]  AUDIT-C Score 0      Divorced STD testing and prevention (HIV/chl/gon/syphilis): 02/14/2019 Hep C: 02/01/2018  Skin cancer: Discussed monitoring for atypical lesions Colorectal cancer: 10/30/2013 Prostate cancer: will order psa Lab Results  Component Value Date   PSA 0.72 04/09/2020   PSA 1.0  02/14/2019   PSA 0.6 02/01/2018    Lung cancer:   Low Dose CT Chest recommended if Age 23-80 years, 30 pack-year currently smoking OR have quit w/in 15years. Patient does qualify.   AAA:  The USPSTF recommends one-time screening with ultrasonography in men ages 18 to 57 years who have ever smoked ECG:  none  Vaccines:  HPV: up to at age 59 , ask insurance if age between 76-45  Shingrix: 59-64 yo and ask insurance if covered when patient above 59 yo Pneumonia:  educated and discussed with patient. Flu:  educated and discussed with patient.  Advanced Care Planning: A voluntary discussion about advance care planning including the explanation and discussion of advance directives.  Discussed health care proxy and Living will, and the patient was able to identify a health care proxy as Jori Moll.  Patient does not have a living will at present time. If patient does have living will, I have requested they bring this to the clinic to be scanned in to their chart.  Patient Active Problem List   Diagnosis Date Noted   Tobacco abuse counseling 02/01/2018   Mixed hyperlipidemia 02/01/2018   Mass of right hand 03/16/2017   Annual physical exam 03/10/2016   Hyperglycemia 03/10/2016   Neck pain, chronic 12/23/2015    Past Surgical History:  Procedure Laterality Date   CYSTOSCOPY WITH URETHRAL DILATATION N/A 11/06/2018   Procedure: CYSTOSCOPY WITH URETHRAL DILATATION;  Surgeon: Abbie Sons, MD;  Location: [facility] ORS;  Service: Urology;  Laterality: N/A;   NO PAST SURGERIES      Family History  Problem Relation Age of Onset   Renal Disease Maternal Grandmother    Diabetes Maternal Grandmother    Diabetes Maternal Grandfather    Cancer Maternal Aunt        Unknown type   Diabetes Maternal Aunt     Social History   Socioeconomic History   Marital status: Divorced    Spouse name: Not on file   Number of children:  3   Years of education: Not on file   Highest education level: Not on  file  Occupational History   Not on file  Tobacco Use   Smoking status: Every Day    Packs/day: 0.50    Years: 40.00    Pack years: 20.00    Types: Cigarettes   Smokeless tobacco: Never   Tobacco comments:    referred   Vaping Use   Vaping Use: Never used  Substance and Sexual Activity   Alcohol use: Yes    Alcohol/week: 0.0 standard drinks    Comment: occasional   Drug use: No   Sexual activity: Yes    Partners: Female  Other Topics Concern   Not on file  Social History Narrative   Works for [employer]  [place] [employer] - travels regularly.   Social Determinants of Health   Financial Resource Strain: Low Risk    Difficulty of Paying Living Expenses: Not hard at all  Food Insecurity: No Food Insecurity   Worried About Charity fundraiser in the Last Year: Never true   [place] in the Last Year: Never true  Transportation Needs: No Transportation Needs   Lack of Transportation (Medical): No   Lack of Transportation (Non-Medical): No  Physical Activity: Sufficiently Active   Days of Exercise per Week: 4 days   Minutes of Exercise per Session: 120 min  Stress: No Stress Concern Present   Feeling of Stress : Only a little  Social Connections: Not on file  Intimate Partner Violence: Not At Risk   Fear of Current or Ex-Partner: No   Emotionally Abused: No   Physically Abused: No   Sexually Abused: No     Current Outpatient Medications:    nicotine (NICODERM CQ - DOSED IN MG/24 HOURS) 21 mg/24hr patch, Place 1 patch (21 mg total) onto the skin daily., Disp: 28 patch, Rfl: 0   atorvastatin (LIPITOR) 20 MG tablet, Take 1 tablet (20 mg total) by mouth at bedtime. (Patient not taking: Reported on 05/20/2021), Disp: 90 tablet, Rfl: 3   calcium carbonate (TUMS - DOSED IN MG ELEMENTAL CALCIUM) 500 MG chewable tablet, Chew 1 tablet by mouth as needed for indigestion or heartburn. (Patient not taking: Reported on 05/20/2021), Disp: , Rfl:    ibuprofen (ADVIL) 600 MG  tablet, Take 1 tablet (600 mg total) by mouth every 8 (eight) hours as needed., Disp: 30 tablet, Rfl: 1   tetrahydrozoline 0.05 % ophthalmic solution, Place 1-2 drops into both eyes 3 (three) times daily as needed (dry/red/irritated eyes.).  (Patient not taking: Reported on 05/20/2021), Disp: , Rfl:   No Known Allergies   ROS  Constitutional: Negative for fever or weight change.  Respiratory: Negative for cough and shortness of breath.   Cardiovascular: Negative for chest pain or palpitations.  Gastrointestinal: Negative for abdominal pain, no bowel changes.  Musculoskeletal: Negative for gait problem or joint swelling.  Skin: Negative for rash.  Neurological: Negative for dizziness or headache.  No other specific complaints in a complete review of systems (except as listed in HPI above).    Objective  Vitals:   05/20/21 1305  BP: 118/78  Pulse: 81  Resp: 18  Temp: 98 F (36.7 C)  TempSrc: Oral  SpO2: 97%  Weight: 165 lb 12.8 oz (75.2 kg)  Height: '5\' 7"'$  (1.702 m)    Body mass index is 25.97 kg/m.  Physical Exam  Constitutional: Patient appears well-developed and well-nourished. No distress.  HENT: Head:  Normocephalic and atraumatic. Ears: B TMs ok, no erythema or effusion; Nose: Nose normal. Mouth/Throat: Oropharynx is clear and moist. No oropharyngeal exudate.  Eyes: Conjunctivae and EOM are normal. Pupils are equal, round, and reactive to light. No scleral icterus.  Neck: Normal range of motion. Neck supple. No JVD present. No thyromegaly present.  Cardiovascular: Normal rate, regular rhythm and normal heart sounds.  No murmur heard. No BLE edema. Pulmonary/Chest: Effort normal and breath sounds normal. No respiratory distress. Abdominal: Soft. Bowel sounds are normal, no distension. There is no tenderness. no masses Musculoskeletal: Normal range of motion, no joint effusions. No gross deformities Neurological: he is alert and oriented to person, place, and time. No  cranial nerve deficit. Coordination, balance, strength, speech and gait are normal.  Skin: Skin is warm and dry. No rash noted. No erythema.  Psychiatric: Patient has a normal mood and affect. behavior is normal. Judgment and thought content normal.   No results found for this or any previous visit (from the past 2160 hour(s)).   Fall Risk: Fall Risk  05/20/2021 04/09/2020 02/14/2019 02/01/2018 03/16/2017  Falls in the past year? 0 0 0 0 No  Number falls in past yr: 0 0 0 0 -  Injury with Fall? 0 0 0 0 -  Follow up Falls evaluation completed - Falls evaluation completed - -      Functional Status Survey: Is the patient deaf or have difficulty hearing?: No Does the patient have difficulty seeing, even when wearing glasses/contacts?: No Does the patient have difficulty concentrating, remembering, or making decisions?: No Does the patient have difficulty walking or climbing stairs?: No Does the patient have difficulty dressing or bathing?: No Does the patient have difficulty doing errands alone such as visiting a doctor's office or shopping?: No    Assessment & Plan  1. Annual physical exam  - Ambulatory Referral Lung Cancer Screening [facility] - Lipid panel - CBC with Differential/Platelet - COMPLETE METABOLIC PANEL WITH GFR - PSA - Hemoglobin A1c  2. Mixed hyperlipidemia -he is not currently taking medications for hyperlipidemia, he was on atorvastatin. His last LDL was 139.  Lipid panel  3. Current smoker -he would like to quit smoking  - Ambulatory Referral Lung Cancer Screening [facility] - nicotine (NICODERM CQ - DOSED IN MG/24 HOURS) 21 mg/24hr patch; Place 1 patch (21 mg total) onto the skin daily.  Dispense: 28 patch; Refill: 0  4. Hyperglycemia  - COMPLETE METABOLIC PANEL WITH GFR - Hemoglobin A1c  5. Prostate cancer screening  - PSA  6. Screening for diabetes mellitus  - COMPLETE METABOLIC PANEL WITH GFR  7. Need for Tdap vaccination  -  Tdap vaccine greater than or equal to 7yo IM    -Prostate cancer screening and PSA options (with potential risks and benefits of testing vs not testing) were discussed along with recent recs/guidelines. -USPSTF grade A and B recommendations reviewed with patient; age-appropriate recommendations, preventive care, screening tests, etc discussed and encouraged; healthy living encouraged; see AVS for patient education given to patient -Discussed importance of 150 minutes of physical activity weekly, eat two servings of fish weekly, eat one serving of tree nuts ( cashews, pistachios, pecans, almonds.Marland Kitchen) every other day, eat 6 servings of fruit/vegetables daily and drink plenty of water and avoid sweet beverages.

## 2021-05-21 LAB — CBC WITH DIFFERENTIAL/PLATELET
Absolute Monocytes: 374 cells/uL (ref 200–950)
Basophils Absolute: 61 cells/uL (ref 0–200)
Basophils Relative: 1.1 %
Eosinophils Absolute: 204 cells/uL (ref 15–500)
Eosinophils Relative: 3.7 %
HCT: 43.9 % (ref 38.5–50.0)
Hemoglobin: 14.1 g/dL (ref 13.2–17.1)
Lymphs Abs: 2222 cells/uL (ref 850–3900)
MCH: 29.3 pg (ref 27.0–33.0)
MCHC: 32.1 g/dL (ref 32.0–36.0)
MCV: 91.3 fL (ref 80.0–100.0)
MPV: 11.9 fL (ref 7.5–12.5)
Monocytes Relative: 6.8 %
Neutro Abs: 2640 cells/uL (ref 1500–7800)
Neutrophils Relative %: 48 %
Platelets: 272 10*3/uL (ref 140–400)
RBC: 4.81 10*6/uL (ref 4.20–5.80)
RDW: 13.4 % (ref 11.0–15.0)
Total Lymphocyte: 40.4 %
WBC: 5.5 10*3/uL (ref 3.8–10.8)

## 2021-05-21 LAB — HEMOGLOBIN A1C
Hgb A1c MFr Bld: 5.6 % of total Hgb (ref <5.7)
Mean Plasma Glucose: 114 mg/dL
eAG (mmol/L): 6.3 mmol/L

## 2021-05-21 LAB — COMPLETE METABOLIC PANEL WITH GFR
AG Ratio: 1.8 (calc) (ref 1.0–2.5)
ALT: 19 U/L (ref 9–46)
AST: 16 U/L (ref 10–35)
Albumin: 4.2 g/dL (ref 3.6–5.1)
Alkaline phosphatase (APISO): 48 U/L (ref 35–144)
BUN: 17 mg/dL (ref 7–25)
CO2: 29 mmol/L (ref 20–32)
Calcium: 9.3 mg/dL (ref 8.6–10.3)
Chloride: 105 mmol/L (ref 98–110)
Creat: 0.95 mg/dL (ref 0.70–1.30)
Globulin: 2.3 g/dL (calc) (ref 1.9–3.7)
Glucose, Bld: 112 mg/dL — ABNORMAL HIGH (ref 65–99)
Potassium: 4 mmol/L (ref 3.5–5.3)
Sodium: 142 mmol/L (ref 135–146)
Total Bilirubin: 0.5 mg/dL (ref 0.2–1.2)
Total Protein: 6.5 g/dL (ref 6.1–8.1)
eGFR: 93 mL/min/{1.73_m2} (ref >=60)

## 2021-05-21 LAB — LIPID PANEL
Cholesterol: 188 mg/dL (ref <200)
HDL: 56 mg/dL (ref >=40)
LDL Cholesterol (Calc): 115 mg/dL (calc) — ABNORMAL HIGH
Non-HDL Cholesterol (Calc): 132 mg/dL (calc) — ABNORMAL HIGH (ref <130)
Total CHOL/HDL Ratio: 3.4 (calc) (ref <5.0)
Triglycerides: 71 mg/dL (ref <150)

## 2021-05-21 LAB — PSA: PSA: 0.84 ng/mL (ref <=4.00)

## 2021-05-24 ENCOUNTER — Telehealth: Payer: Self-pay | Admitting: Emergency Medicine

## 2021-05-24 ENCOUNTER — Other Ambulatory Visit: Payer: Self-pay | Admitting: Nurse Practitioner

## 2021-05-24 DIAGNOSIS — E782 Mixed hyperlipidemia: Secondary | ICD-10-CM

## 2021-05-24 MED ORDER — ATORVASTATIN CALCIUM 20 MG PO TABS: 20.0000 mg | ORAL_TABLET | Freq: Every day | ORAL | 3 refills | Status: DC

## 2021-05-24 NOTE — Telephone Encounter (Signed)
Please send in Cholesterol medication per labs. Pharmacy has been changed ?

## 2021-06-29 ENCOUNTER — Other Ambulatory Visit: Payer: Self-pay

## 2021-06-29 DIAGNOSIS — Z122 Encounter for screening for malignant neoplasm of respiratory organs: Secondary | ICD-10-CM

## 2021-06-29 DIAGNOSIS — Z87891 Personal history of nicotine dependence: Secondary | ICD-10-CM

## 2021-06-29 DIAGNOSIS — F17210 Nicotine dependence, cigarettes, uncomplicated: Secondary | ICD-10-CM

## 2021-07-08 ENCOUNTER — Ambulatory Visit: Admission: RE | Admit: 2021-07-08 | Payer: 59 | Source: Ambulatory Visit

## 2022-04-16 IMAGING — CT CT CHEST LUNG CANCER SCREENING LOW DOSE W/O CM
2 of 5 series · 15 of 40 positions shown, 18 images · non-contrast
Comparison: No priors.

CLINICAL DATA: 59-year-old male current smoker with greater than 20
pack-year history of smoking. Lung cancer screening examination.

EXAM:
CT CHEST WITHOUT CONTRAST LOW-DOSE FOR LUNG CANCER SCREENING
TECHNIQUE: Multidetector CT imaging of the chest was performed following the
standard protocol without IV contrast.

[Series 4: lung 1.00 · axial · 0.63mm/px · z∈[-1251,-930]mm · 12 of 355 slices shown, 15 images]
[im 17/355  mediastinal]
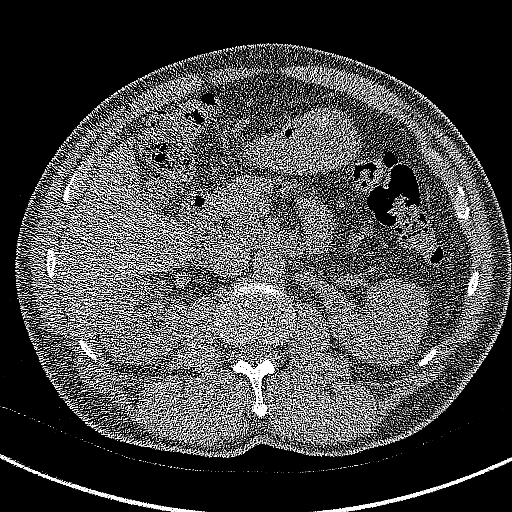
[im 17/355  lung]
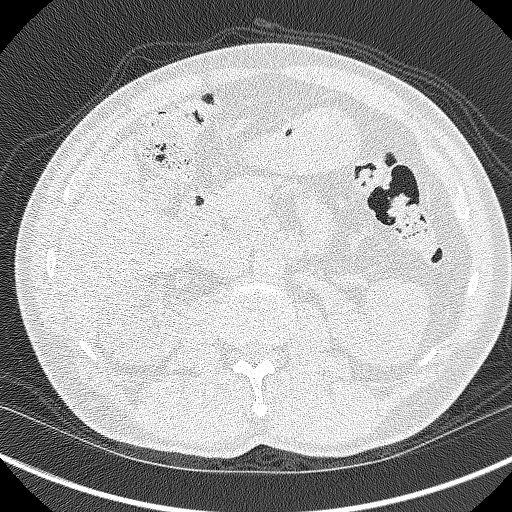
[im 49/355  lung]
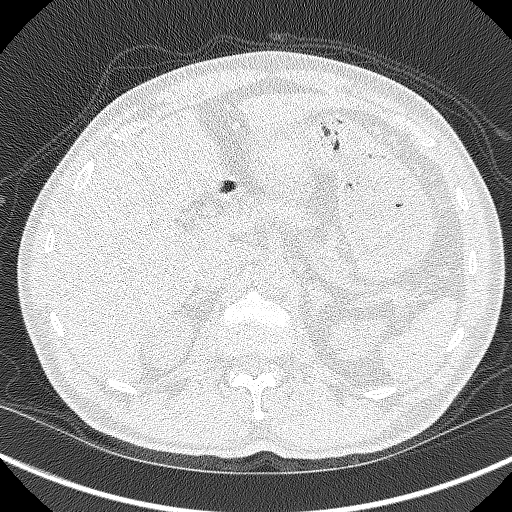
[im 81/355  lung]
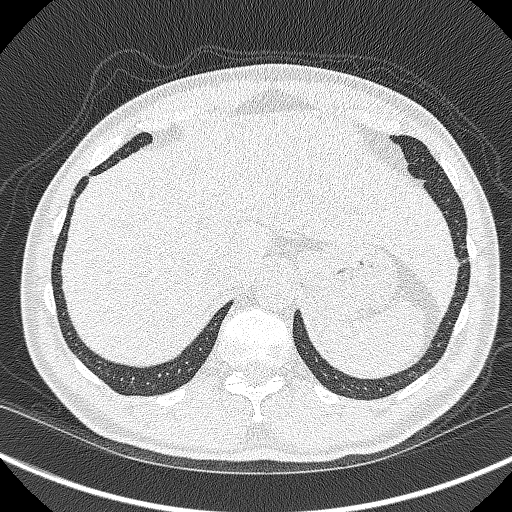
[im 113/355  lung]
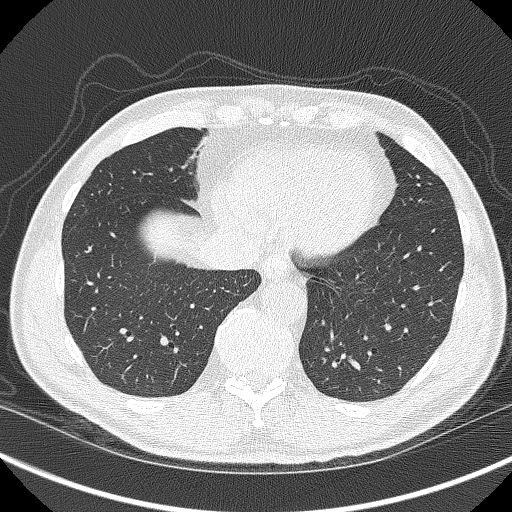
[im 129/355  mediastinal]
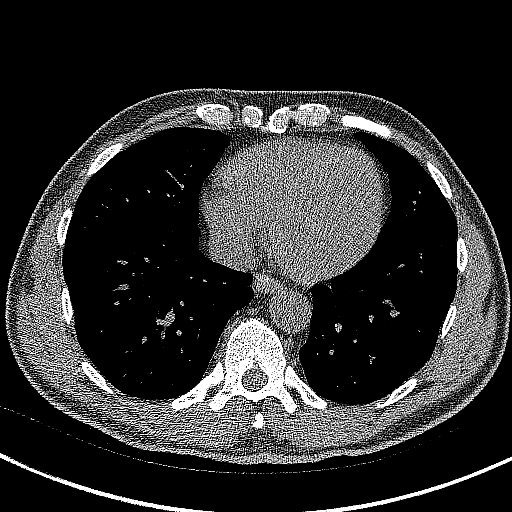
[im 129/355  lung]
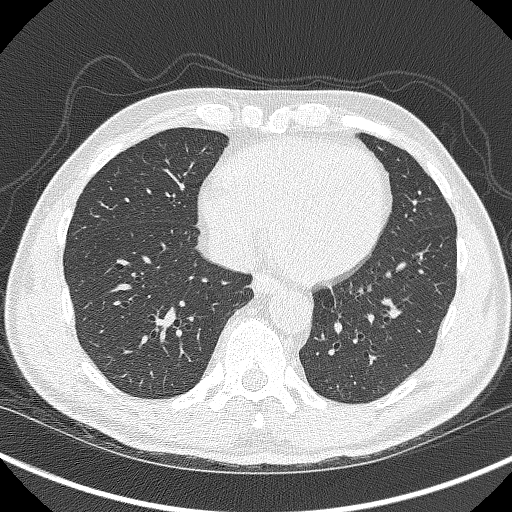
[im 161/355  lung]
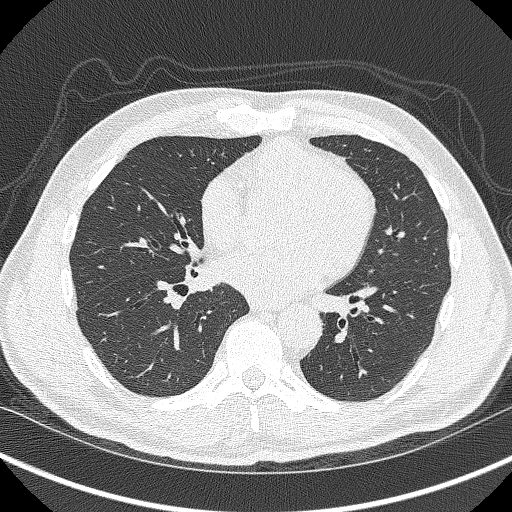
[im 194/355  lung]
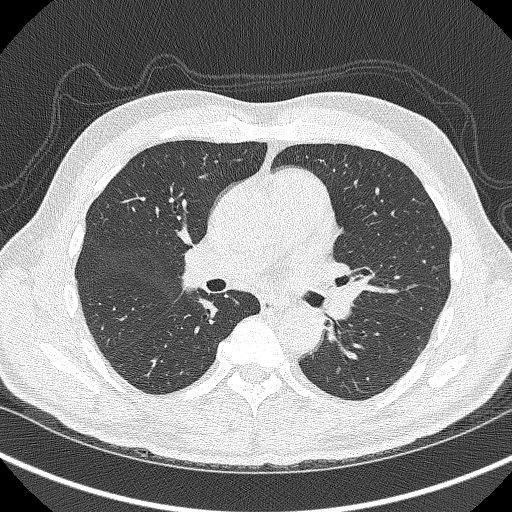
[im 226/355  lung]
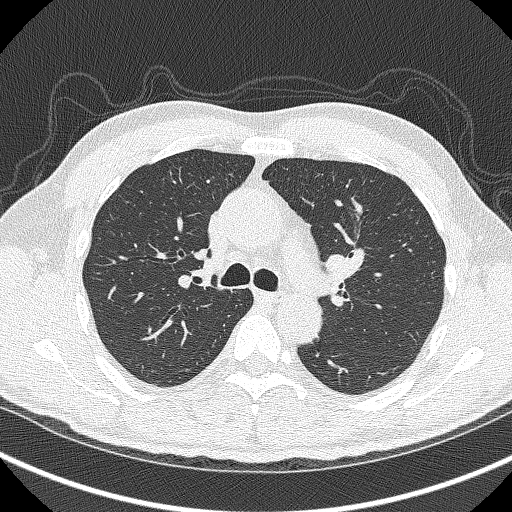
[im 242/355  mediastinal]
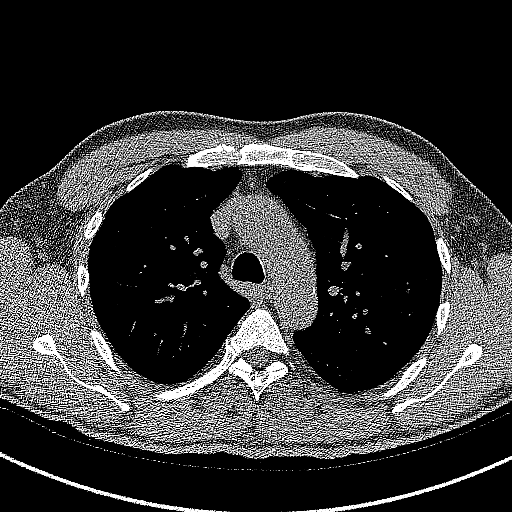
[im 242/355  lung]
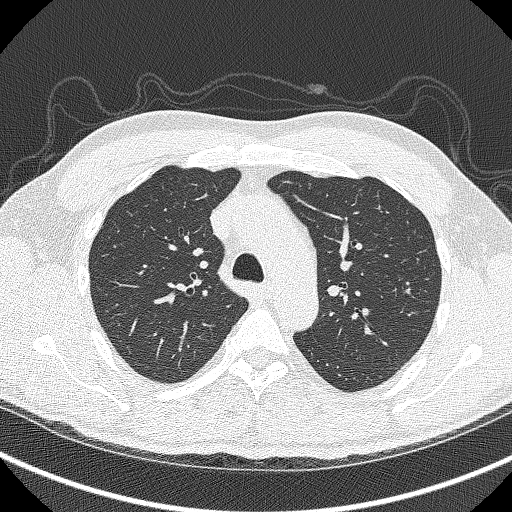
[im 274/355  lung]
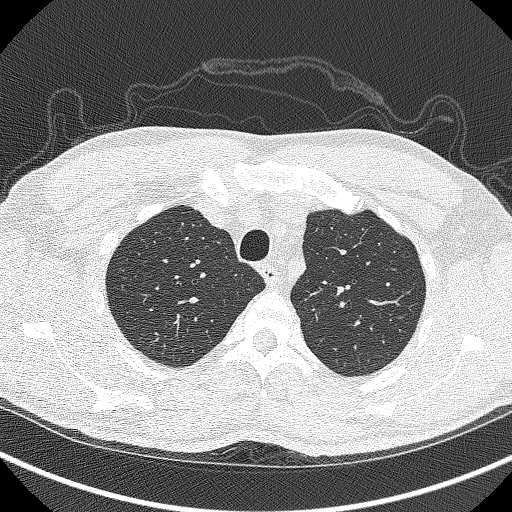
[im 306/355  lung]
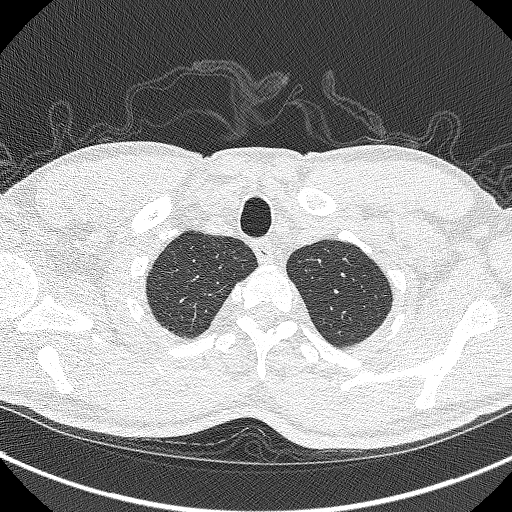
[im 338/355  lung]
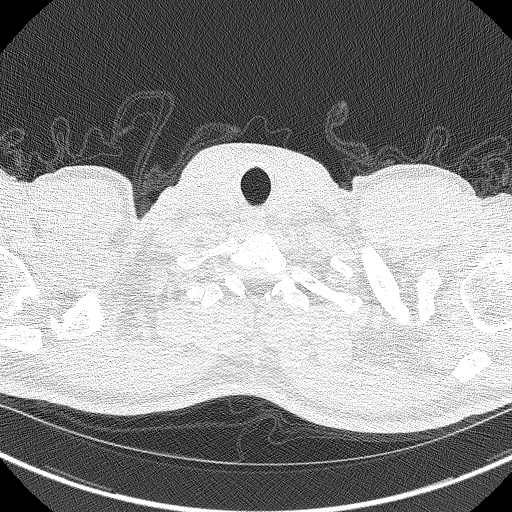

[Series 5: coronals lung 1.00 cor · coronal · 0.63mm/px · 3 of 254 slices shown]
[im 51/254  lung]
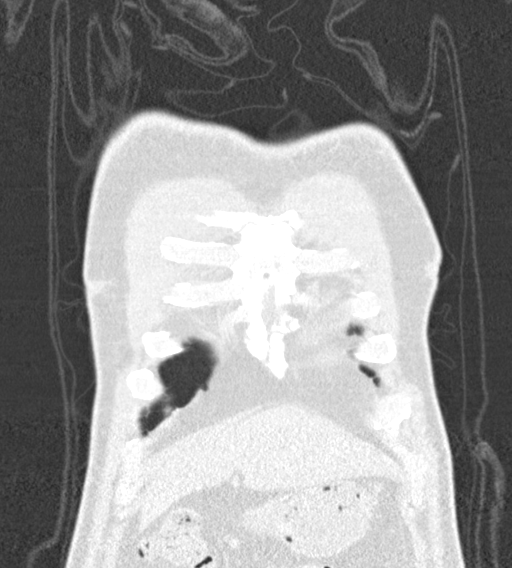
[im 102/254  lung]
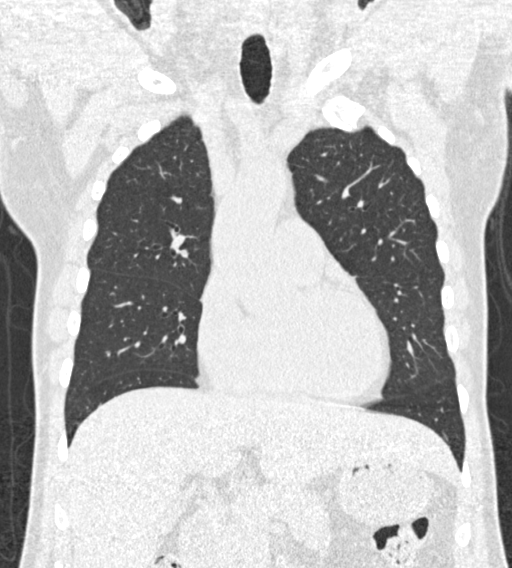
[im 152/254  lung]
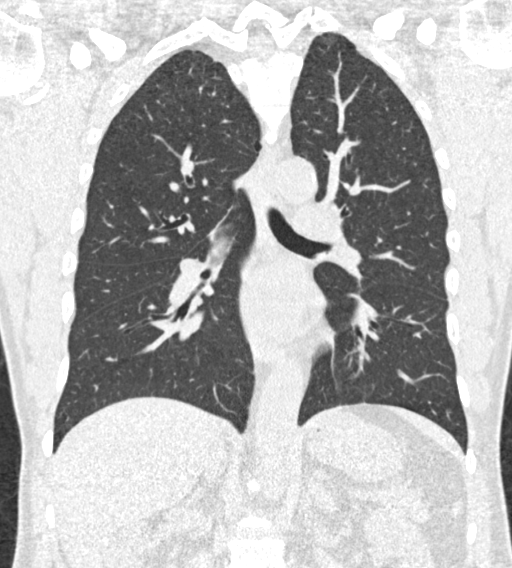

[15 of 40 positions shown; findings below may reference images not displayed]

FINDINGS: Cardiovascular: Heart size is normal. There is no significant
pericardial fluid, thickening or pericardial calcification. Aortic
atherosclerosis. No definite coronary artery calcifications.

Mediastinum/Nodes: No pathologically enlarged mediastinal or hilar
lymph nodes. Please note that accurate exclusion of hilar adenopathy
is limited on noncontrast CT scans. Esophagus is unremarkable in
appearance. No axillary lymphadenopathy.

Lungs/Pleura: Small pulmonary nodules are noted in the lungs,
largest of which is in the medial aspect of the apex of the left
upper lobe (axial image 33 of series 3), with a volume derived mean
diameter of 4.9 mm. No other larger more suspicious appearing
pulmonary nodules or masses are noted. No acute consolidative
airspace disease. No pleural effusions. Mild diffuse bronchial wall
thickening with very mild centrilobular and paraseptal emphysema.

Upper Abdomen: Unremarkable.

Musculoskeletal: There are no aggressive appearing lytic or blastic
lesions noted in the visualized portions of the skeleton.
IMPRESSION: 1. Lung-RADS 2, benign appearance or behavior. Continue annual
screening with low-dose chest CT without contrast in 12 months.
2. Aortic atherosclerosis.
3. Mild diffuse bronchial wall thickening with very mild
centrilobular and paraseptal emphysema; imaging findings suggestive
of underlying COPD.

Aortic Atherosclerosis (WXS79-4V0.0) and Emphysema (WXS79-THH.P).

## 2022-05-26 ENCOUNTER — Encounter: Payer: Self-pay | Admitting: Nurse Practitioner

## 2022-05-26 ENCOUNTER — Other Ambulatory Visit: Payer: Self-pay | Admitting: Nurse Practitioner

## 2022-05-26 ENCOUNTER — Other Ambulatory Visit: Payer: Self-pay

## 2022-05-26 ENCOUNTER — Ambulatory Visit (INDEPENDENT_AMBULATORY_CARE_PROVIDER_SITE_OTHER): Payer: 59 | Admitting: Nurse Practitioner

## 2022-05-26 ENCOUNTER — Telehealth: Payer: Self-pay | Admitting: Family Medicine

## 2022-05-26 VITALS — BP 118/72 | HR 100 | Temp 97.8°F | Resp 18 | Ht 67.0 in | Wt 168.9 lb

## 2022-05-26 DIAGNOSIS — Z131 Encounter for screening for diabetes mellitus: Secondary | ICD-10-CM

## 2022-05-26 DIAGNOSIS — F17200 Nicotine dependence, unspecified, uncomplicated: Secondary | ICD-10-CM

## 2022-05-26 DIAGNOSIS — Z125 Encounter for screening for malignant neoplasm of prostate: Secondary | ICD-10-CM | POA: Diagnosis not present

## 2022-05-26 DIAGNOSIS — E782 Mixed hyperlipidemia: Secondary | ICD-10-CM

## 2022-05-26 DIAGNOSIS — G8929 Other chronic pain: Secondary | ICD-10-CM

## 2022-05-26 DIAGNOSIS — Z0000 Encounter for general adult medical examination without abnormal findings: Secondary | ICD-10-CM

## 2022-05-26 DIAGNOSIS — D229 Melanocytic nevi, unspecified: Secondary | ICD-10-CM

## 2022-05-26 DIAGNOSIS — Z130 Encounter for screening for diseases of the blood and blood-forming organs and certain disorders involving the immune mechanism: Secondary | ICD-10-CM

## 2022-05-26 MED ORDER — IBUPROFEN 600 MG PO TABS: 600.0000 mg | ORAL_TABLET | Freq: Three times a day (TID) | ORAL | 1 refills | Status: DC | PRN

## 2022-05-26 NOTE — Progress Notes (Signed)
Name: Joel Clay   MRN: [id number]    DOB: Jan 30, 1961   Date:05/26/2022       Progress Note  Subjective  Chief Complaint  Chief Complaint  Patient presents with   Annual Exam    HPI  Patient presents for annual CPE .  IPSS Questionnaire (AUA-7): Over the past month.   1)  How often have you had a sensation of not emptying your bladder completely after you finish urinating?  0 - Not at all  2)  How often have you had to urinate again less than two hours after you finished urinating? 0 - Not at all  3)  How often have you found you stopped and started again several times when you urinated?  0 - Not at all  4) How difficult have you found it to postpone urination?  0 - Not at all  5) How often have you had a weak urinary stream?  1 - Less than 1 time in 5  6) How often have you had to push or strain to begin urination?  0 - Not at all  7) How many times did you most typically get up to urinate from the time you went to bed until the time you got up in the morning?  0 - None  Total score:  0-7 mildly symptomatic   8-19 moderately symptomatic   20-35 severely symptomatic     Diet: eats whatever he wants Exercise: yard work, and physical job, nothing beyond that Sleep: unsure  Depression: phq 9 is negative    05/26/2022    1:34 PM 05/20/2021    1:04 PM 04/09/2020    3:12 PM 02/14/2019   12:54 PM 02/01/2018    1:09 PM  Depression screen PHQ 2/9  Decreased Interest 0 0 0 0 0  Down, Depressed, Hopeless 0 0 0 0 0  PHQ - 2 Score 0 0 0 0 0  Altered sleeping  0 0 0 0  Tired, decreased energy  0 0 0 0  Change in appetite  0 0 0 0  Feeling bad or failure about yourself   0 0 0 0  Trouble concentrating  0 0 0 0  Moving slowly or fidgety/restless  0 0 0 0  Suicidal thoughts  0 0 0 0  PHQ-9 Score  0 0 0 0  Difficult doing work/chores  Not difficult at all Not difficult at all Not difficult at all Not difficult at all    Hypertension:  BP Readings from Last 3 Encounters:   05/26/22 118/72  05/20/21 118/78  04/09/20 128/82    Obesity: Wt Readings from Last 3 Encounters:  05/26/22 168 lb 14.4 oz (76.6 kg)  05/20/21 165 lb 12.8 oz (75.2 kg)  05/07/20 172 lb (78 kg)   BMI Readings from Last 3 Encounters:  05/26/22 26.45 kg/m  05/20/21 25.97 kg/m  05/07/20 26.94 kg/m     Lipids:  Lab Results  Component Value Date   CHOL 188 05/20/2021   CHOL 220 (H) 04/09/2020   CHOL 227 (H) 02/14/2019   Lab Results  Component Value Date   HDL 56 05/20/2021   HDL 55 04/09/2020   HDL 59 02/14/2019   Lab Results  Component Value Date   LDLCALC 115 (H) 05/20/2021   LDLCALC 139 (H) 04/09/2020   LDLCALC 148 (H) 02/14/2019   Lab Results  Component Value Date   TRIG 71 05/20/2021   TRIG 134 04/09/2020   TRIG 96 02/14/2019  Lab Results  Component Value Date   CHOLHDL 3.4 05/20/2021   CHOLHDL 4.0 04/09/2020   CHOLHDL 3.8 02/14/2019   No results found for: "LDLDIRECT" Glucose:  Glucose  Date Value Ref Range Status  08/06/2013 119 (H) 65 - 99 mg/dL Final   Glucose, Bld  Date Value Ref Range Status  05/20/2021 112 (H) 65 - 99 mg/dL Final    Comment:    .            Fasting reference interval . For someone without known diabetes, a glucose value between 100 and 125 mg/dL is consistent with prediabetes and should be confirmed with a follow-up test. .   04/09/2020 84 65 - 99 mg/dL Final    Comment:    .            Fasting reference interval .   02/14/2019 144 (H) 65 - 99 mg/dL Final    Comment:    .            Fasting reference interval . For someone without known diabetes, a glucose value >125 mg/dL indicates that they may have diabetes and this should be confirmed with a follow-up test. .     [place] [facility] Visit from 05/26/2022 in [facility]  AUDIT-C Score 1        Divorced STD testing and prevention (HIV/chl/gon/syphilis): 02/14/2019 Hep C: 02/01/2018  Skin cancer: Discussed  monitoring for atypical lesions Colorectal cancer: 10/30/2013 Prostate cancer: ordered Lab Results  Component Value Date   PSA 0.84 05/20/2021   PSA 0.72 04/09/2020   PSA 1.0 02/14/2019     Lung cancer:   Low Dose CT Chest recommended if Age 5-80 years, 30 pack-year currently smoking OR have quit w/in 15years. Patient does qualify.   AAA:  The USPSTF recommends one-time screening with ultrasonography in men ages 7 to 54 years who have ever smoked ECG:  none  Vaccines:  HPV: up to at age 60 , ask insurance if age between 53-45  Shingrix: 23-60 yo and ask insurance if covered when patient above 60 yo Pneumonia:  educated and discussed with patient. Flu:  educated and discussed with patient.  Advanced Care Planning: A voluntary discussion about advance care planning including the explanation and discussion of advance directives.  Discussed health care proxy and Living will, and the patient was able to identify a health care proxy as aunt and uncle.  Patient does not have a living will at present time. If patient does have living will, I have requested they bring this to the clinic to be scanned in to their chart.  Patient Active Problem List   Diagnosis Date Noted   Tobacco abuse counseling 02/01/2018   Mixed hyperlipidemia 02/01/2018   Mass of right hand 03/16/2017   Annual physical exam 03/10/2016   Hyperglycemia 03/10/2016   Neck pain, chronic 12/23/2015    Past Surgical History:  Procedure Laterality Date   CYSTOSCOPY WITH URETHRAL DILATATION N/A 11/06/2018   Procedure: CYSTOSCOPY WITH URETHRAL DILATATION;  Surgeon: Abbie Sons, MD;  Location: [facility] ORS;  Service: Urology;  Laterality: N/A;   NO PAST SURGERIES      Family History  Problem Relation Age of Onset   Renal Disease Maternal Grandmother    Diabetes Maternal Grandmother    Diabetes Maternal Grandfather    Cancer Maternal Aunt        Unknown type   Diabetes Maternal Aunt     Social History  Socioeconomic History   Marital status: Divorced    Spouse name: Not on file   Number of children: 3   Years of education: Not on file   Highest education level: Not on file  Occupational History   Not on file  Tobacco Use   Smoking status: Every Day    Packs/day: 0.50    Years: 40.00    Additional pack years: 0.00    Total pack years: 20.00    Types: Cigarettes   Smokeless tobacco: Never   Tobacco comments:    referred   Vaping Use   Vaping Use: Never used  Substance and Sexual Activity   Alcohol use: Yes    Alcohol/week: 0.0 standard drinks of alcohol    Comment: occasional   Drug use: No   Sexual activity: Yes    Partners: Female  Other Topics Concern   Not on file  Social History Narrative   Works for [employer]  [place] [employer] - travels regularly.   Social Determinants of Health   Financial Resource Strain: Low Risk  (05/26/2022)   Overall Financial Resource Strain (CARDIA)    Difficulty of Paying Living Expenses: Not hard at all  Food Insecurity: No Food Insecurity (05/26/2022)   Hunger Vital Sign    Worried About Running Out of Food in the Last Year: Never true    Ran Out of Food in the Last Year: Never true  Transportation Needs: No Transportation Needs (05/26/2022)   PRAPARE - Hydrologist (Medical): No    Lack of Transportation (Non-Medical): No  Physical Activity: Sufficiently Active (05/26/2022)   Exercise Vital Sign    Days of Exercise per Week: 5 days    Minutes of Exercise per Session: 60 min  Stress: No Stress Concern Present (05/26/2022)   [place]    Feeling of Stress : Not at all  Social Connections: Socially Isolated (05/26/2022)   Social Connection and Isolation Panel [NHANES]    Frequency of Communication with Friends and Family: More than three times a week    Frequency of Social Gatherings with Friends and Family: More than three times a  week    Attends Religious Services: Never    Marine scientist or Organizations: No    Attends Archivist Meetings: Never    Marital Status: Divorced  Human resources officer Violence: Not At Risk (05/26/2022)   Humiliation, Afraid, Rape, and Kick questionnaire    Fear of Current or Ex-Partner: No    Emotionally Abused: No    Physically Abused: No    Sexually Abused: No     Current Outpatient Medications:    atorvastatin (LIPITOR) 20 MG tablet, Take 1 tablet (20 mg total) by mouth daily., Disp: 90 tablet, Rfl: 3   ibuprofen (ADVIL) 600 MG tablet, Take 1 tablet (600 mg total) by mouth every 8 (eight) hours as needed., Disp: 30 tablet, Rfl: 1   nicotine (NICODERM CQ - DOSED IN MG/24 HOURS) 21 mg/24hr patch, Place 1 patch (21 mg total) onto the skin daily. (Patient not taking: Reported on 05/26/2022), Disp: 28 patch, Rfl: 0  No Known Allergies   ROS  Constitutional: Negative for fever or weight change.  Respiratory: Negative for cough and shortness of breath.   Cardiovascular: Negative for chest pain or palpitations.  Gastrointestinal: Negative for abdominal pain, no bowel changes.  Musculoskeletal: Negative for gait problem or joint swelling.  Skin: Negative for rash.  Neurological: Negative for dizziness or headache.  No other specific complaints in a complete review of systems (except as listed in HPI above).    Objective  Vitals:   05/26/22 1327  BP: 118/72  Pulse: 100  Resp: 18  Temp: 97.8 F (36.6 C)  TempSrc: Oral  SpO2: 97%  Weight: 168 lb 14.4 oz (76.6 kg)  Height: 5\' 7"  (1.702 m)    Body mass index is 26.45 kg/m.  Physical Exam Constitutional: Patient appears well-developed and well-nourished. No distress.  HENT: Head: Normocephalic and atraumatic. Ears: B TMs ok, no erythema or effusion; Nose: Nose normal. Mouth/Throat: Oropharynx is clear and moist. No oropharyngeal exudate.  Eyes: Conjunctivae and EOM are normal. Pupils are equal, round, and  reactive to light. No scleral icterus.  Neck: Normal range of motion. Neck supple. No JVD present. No thyromegaly present.  Cardiovascular: Normal rate, regular rhythm and normal heart sounds.  No murmur heard. No BLE edema. Pulmonary/Chest: Effort normal and breath sounds normal. No respiratory distress. Abdominal: Soft. Bowel sounds are normal, no distension. There is no tenderness. no masses Musculoskeletal: Normal range of motion, no joint effusions. No gross deformities Neurological: he is alert and oriented to person, place, and time. No cranial nerve deficit. Coordination, balance, strength, speech and gait are normal.  Skin: Skin is warm and dry. No rash noted. No erythema.  Psychiatric: Patient has a normal mood and affect. behavior is normal. Judgment and thought content normal.   No results found for this or any previous visit (from the past 2160 hour(s)).   Fall Risk:    05/26/2022    1:33 PM 05/20/2021    1:03 PM 04/09/2020    3:12 PM 02/14/2019   12:54 PM 02/01/2018    1:08 PM  Fall Risk   Falls in the past year? 0 0 0 0 0  Number falls in past yr: 0 0 0 0 0  Injury with Fall? 0 0 0 0 0  Follow up  Falls evaluation completed  Falls evaluation completed       Functional Status Survey: Is the patient deaf or have difficulty hearing?: No Does the patient have difficulty seeing, even when wearing glasses/contacts?: Yes Does the patient have difficulty concentrating, remembering, or making decisions?: No Does the patient have difficulty walking or climbing stairs?: No Does the patient have difficulty dressing or bathing?: No Does the patient have difficulty doing errands alone such as visiting a doctor's office or shopping?: No   Assessment & Plan  1. Annual physical exam Eat well balanced diet and increase physical activity - Ambulatory Referral Lung Cancer Screening [facility] - CBC with Differential/Platelet - COMPLETE METABOLIC PANEL WITH GFR - Lipid  panel - Hemoglobin A1c - PSA  2. Current smoker  - Ambulatory Referral Lung Cancer Screening [facility]  3. Prostate cancer screening  - PSA  4. Mixed hyperlipidemia  - Lipid panel  5. Screening for diabetes mellitus  - COMPLETE METABOLIC PANEL WITH GFR - Hemoglobin A1c  6. Screening for deficiency anemia  - CBC with Differential/Platelet    -Prostate cancer screening and PSA options (with potential risks and benefits of testing vs not testing) were discussed along with recent recs/guidelines. -USPSTF grade A and B recommendations reviewed with patient; age-appropriate recommendations, preventive care, screening tests, etc discussed and encouraged; healthy living encouraged; see AVS for patient education given to patient -Discussed importance of 150 minutes of physical activity weekly, eat two servings of fish weekly, eat one serving of  tree nuts ( cashews, pistachios, pecans, almonds.Marland Kitchen) every other day, eat 6 servings of fruit/vegetables daily and drink plenty of water and avoid sweet beverages.

## 2022-05-26 NOTE — Telephone Encounter (Signed)
done

## 2022-05-26 NOTE — Telephone Encounter (Signed)
Pt seen today and wanting to remind you to send in ibuprofen please send to [facility] in [place]

## 2022-05-27 LAB — HEMOGLOBIN A1C
Hgb A1c MFr Bld: 6 % of total Hgb — ABNORMAL HIGH (ref <5.7)
Mean Plasma Glucose: 126 mg/dL
eAG (mmol/L): 7 mmol/L

## 2022-05-27 LAB — COMPLETE METABOLIC PANEL WITH GFR
AG Ratio: 1.7 (calc) (ref 1.0–2.5)
ALT: 24 U/L (ref 9–46)
AST: 18 U/L (ref 10–35)
Albumin: 4.2 g/dL (ref 3.6–5.1)
Alkaline phosphatase (APISO): 56 U/L (ref 35–144)
BUN: 20 mg/dL (ref 7–25)
CO2: 28 mmol/L (ref 20–32)
Calcium: 9.3 mg/dL (ref 8.6–10.3)
Chloride: 105 mmol/L (ref 98–110)
Creat: 1.11 mg/dL (ref 0.70–1.30)
Globulin: 2.5 g/dL (calc) (ref 1.9–3.7)
Glucose, Bld: 89 mg/dL (ref 65–99)
Potassium: 4.6 mmol/L (ref 3.5–5.3)
Sodium: 142 mmol/L (ref 135–146)
Total Bilirubin: 0.4 mg/dL (ref 0.2–1.2)
Total Protein: 6.7 g/dL (ref 6.1–8.1)
eGFR: 76 mL/min/{1.73_m2} (ref >=60)

## 2022-05-27 LAB — CBC WITH DIFFERENTIAL/PLATELET
Absolute Monocytes: 540 cells/uL (ref 200–950)
Basophils Absolute: 66 cells/uL (ref 0–200)
Basophils Relative: 0.8 %
Eosinophils Absolute: 141 cells/uL (ref 15–500)
Eosinophils Relative: 1.7 %
HCT: 43.2 % (ref 38.5–50.0)
Hemoglobin: 14.5 g/dL (ref 13.2–17.1)
Lymphs Abs: 2108 cells/uL (ref 850–3900)
MCH: 29.5 pg (ref 27.0–33.0)
MCHC: 33.6 g/dL (ref 32.0–36.0)
MCV: 88 fL (ref 80.0–100.0)
MPV: 11.3 fL (ref 7.5–12.5)
Monocytes Relative: 6.5 %
Neutro Abs: 5445 cells/uL (ref 1500–7800)
Neutrophils Relative %: 65.6 %
Platelets: 308 10*3/uL (ref 140–400)
RBC: 4.91 10*6/uL (ref 4.20–5.80)
RDW: 13.6 % (ref 11.0–15.0)
Total Lymphocyte: 25.4 %
WBC: 8.3 10*3/uL (ref 3.8–10.8)

## 2022-05-27 LAB — LIPID PANEL
Cholesterol: 211 mg/dL — ABNORMAL HIGH (ref <200)
HDL: 45 mg/dL (ref >=40)
LDL Cholesterol (Calc): 132 mg/dL (calc) — ABNORMAL HIGH
Non-HDL Cholesterol (Calc): 166 mg/dL (calc) — ABNORMAL HIGH (ref <130)
Total CHOL/HDL Ratio: 4.7 (calc) (ref <5.0)
Triglycerides: 196 mg/dL — ABNORMAL HIGH (ref <150)

## 2022-05-27 LAB — PSA: PSA: 0.7 ng/mL (ref <=4.00)

## 2022-08-30 NOTE — Progress Notes (Deleted)
Name: Joel Clay   MRN: [id number]    DOB: 03-Aug-1960   Date:08/30/2022       Progress Note  Subjective  Chief Complaint  Follow Up  HPI  *** Patient Active Problem List   Diagnosis Date Noted   Tobacco abuse counseling 02/01/2018   Mixed hyperlipidemia 02/01/2018   Mass of right hand 03/16/2017   Annual physical exam 03/10/2016   Hyperglycemia 03/10/2016   Neck pain, chronic 12/23/2015    Past Surgical History:  Procedure Laterality Date   CYSTOSCOPY WITH URETHRAL DILATATION N/A 11/06/2018   Procedure: CYSTOSCOPY WITH URETHRAL DILATATION;  Surgeon: Riki Altes, MD;  Location: [facility] ORS;  Service: Urology;  Laterality: N/A;   NO PAST SURGERIES      Family History  Problem Relation Age of Onset   Renal Disease Maternal Grandmother    Diabetes Maternal Grandmother    Diabetes Maternal Grandfather    Cancer Maternal Aunt        Unknown type   Diabetes Maternal Aunt     Social History   Tobacco Use   Smoking status: Every Day    Packs/day: 0.50    Years: 40.00    Additional pack years: 0.00    Total pack years: 20.00    Types: Cigarettes   Smokeless tobacco: Never   Tobacco comments:    referred   Substance Use Topics   Alcohol use: Yes    Alcohol/week: 0.0 standard drinks of alcohol    Comment: occasional     Current Outpatient Medications:    atorvastatin (LIPITOR) 20 MG tablet, Take 1 tablet (20 mg total) by mouth daily., Disp: 90 tablet, Rfl: 3   ibuprofen (ADVIL) 600 MG tablet, Take 1 tablet (600 mg total) by mouth every 8 (eight) hours as needed., Disp: 90 tablet, Rfl: 1   nicotine (NICODERM CQ - DOSED IN MG/24 HOURS) 21 mg/24hr patch, Place 1 patch (21 mg total) onto the skin daily. (Patient not taking: Reported on 05/26/2022), Disp: 28 patch, Rfl: 0  No Known Allergies  I personally reviewed active problem list, medication list, allergies, family history, social history, health maintenance with the patient/caregiver  today.   ROS  ***  Objective  There were no vitals filed for this visit.  There is no height or weight on file to calculate BMI.  Physical Exam ***  No results found for this or any previous visit (from the past 2160 hour(s)).   PHQ2/9:    05/26/2022    1:34 PM 05/20/2021    1:04 PM 04/09/2020    3:12 PM 02/14/2019   12:54 PM 02/01/2018    1:09 PM  Depression screen PHQ 2/9  Decreased Interest 0 0 0 0 0  Down, Depressed, Hopeless 0 0 0 0 0  PHQ - 2 Score 0 0 0 0 0  Altered sleeping  0 0 0 0  Tired, decreased energy  0 0 0 0  Change in appetite  0 0 0 0  Feeling bad or failure about yourself   0 0 0 0  Trouble concentrating  0 0 0 0  Moving slowly or fidgety/restless  0 0 0 0  Suicidal thoughts  0 0 0 0  PHQ-9 Score  0 0 0 0  Difficult doing work/chores  Not difficult at all Not difficult at all Not difficult at all Not difficult at all    phq 9 is {gen pos [id number]}   Fall Risk:    05/26/2022    1:33  PM 05/20/2021    1:03 PM 04/09/2020    3:12 PM 02/14/2019   12:54 PM 02/01/2018    1:08 PM  Fall Risk   Falls in the past year? 0 0 0 0 0  Number falls in past yr: 0 0 0 0 0  Injury with Fall? 0 0 0 0 0  Follow up  Falls evaluation completed  Falls evaluation completed       Functional Status Survey:      Assessment & Plan  *** There are no diagnoses linked to this encounter.

## 2022-08-31 ENCOUNTER — Ambulatory Visit: Payer: 59 | Admitting: Family Medicine

## 2022-09-08 ENCOUNTER — Ambulatory Visit: Payer: 59 | Admitting: Family Medicine

## 2022-09-08 ENCOUNTER — Encounter: Payer: Self-pay | Admitting: Family Medicine

## 2022-09-08 VITALS — BP 126/68 | HR 87 | Resp 16 | Ht 67.0 in | Wt 172.0 lb

## 2022-09-08 DIAGNOSIS — I700 Atherosclerosis of aorta: Secondary | ICD-10-CM

## 2022-09-08 DIAGNOSIS — G8929 Other chronic pain: Secondary | ICD-10-CM

## 2022-09-08 DIAGNOSIS — M545 Low back pain, unspecified: Secondary | ICD-10-CM

## 2022-09-08 DIAGNOSIS — J438 Other emphysema: Secondary | ICD-10-CM | POA: Diagnosis not present

## 2022-09-08 DIAGNOSIS — Z87442 Personal history of urinary calculi: Secondary | ICD-10-CM

## 2022-09-08 DIAGNOSIS — K769 Liver disease, unspecified: Secondary | ICD-10-CM

## 2022-09-08 DIAGNOSIS — E782 Mixed hyperlipidemia: Secondary | ICD-10-CM | POA: Diagnosis not present

## 2022-09-08 DIAGNOSIS — M25561 Pain in right knee: Secondary | ICD-10-CM

## 2022-09-08 DIAGNOSIS — F17200 Nicotine dependence, unspecified, uncomplicated: Secondary | ICD-10-CM

## 2022-09-08 MED ORDER — ATORVASTATIN CALCIUM 40 MG PO TABS: 40.0000 mg | ORAL_TABLET | Freq: Every day | ORAL | 3 refills | Status: DC

## 2022-09-08 MED ORDER — IBUPROFEN 600 MG PO TABS
600.0000 mg | ORAL_TABLET | Freq: Three times a day (TID) | ORAL | 0 refills | Status: DC | PRN
Start: 2022-09-08 — End: 2023-03-12
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
## 2022-09-22 ENCOUNTER — Ambulatory Visit
Admission: RE | Admit: 2022-09-22 | Discharge: 2022-09-22 | Disposition: A | Discharge status: Home / Self Care | Payer: 59 | Source: Ambulatory Visit | Attending: Family Medicine | Admitting: Family Medicine

## 2022-09-22 DIAGNOSIS — K769 Liver disease, unspecified: Secondary | ICD-10-CM | POA: Insufficient documentation

## 2023-03-09 ENCOUNTER — Ambulatory Visit: Payer: 59 | Admitting: Internal Medicine

## 2023-03-12 ENCOUNTER — Encounter: Payer: Self-pay | Admitting: Internal Medicine

## 2023-03-12 ENCOUNTER — Ambulatory Visit: Payer: 59 | Admitting: Internal Medicine

## 2023-03-12 ENCOUNTER — Other Ambulatory Visit: Payer: Self-pay

## 2023-03-12 VITALS — BP 128/72 | HR 95 | Temp 98.2°F | Resp 16 | Ht 67.0 in | Wt 171.5 lb

## 2023-03-12 DIAGNOSIS — Z5320 Procedure and treatment not carried out because of patient's decision for unspecified reasons: Secondary | ICD-10-CM

## 2023-03-12 DIAGNOSIS — I700 Atherosclerosis of aorta: Secondary | ICD-10-CM | POA: Insufficient documentation

## 2023-03-12 DIAGNOSIS — M25561 Pain in right knee: Secondary | ICD-10-CM

## 2023-03-12 DIAGNOSIS — R7303 Prediabetes: Secondary | ICD-10-CM | POA: Insufficient documentation

## 2023-03-12 DIAGNOSIS — E782 Mixed hyperlipidemia: Secondary | ICD-10-CM

## 2023-03-12 DIAGNOSIS — G8929 Other chronic pain: Secondary | ICD-10-CM

## 2023-03-12 LAB — POCT GLYCOSYLATED HEMOGLOBIN (HGB A1C): Hemoglobin A1C: 6.1 % — AB (ref 4.0–5.6)

## 2023-03-12 MED ORDER — IBUPROFEN 600 MG PO TABS
600.0000 mg | ORAL_TABLET | Freq: Three times a day (TID) | ORAL | 0 refills | Status: AC | PRN
Start: 2023-03-12 — End: ?

## 2023-03-12 NOTE — Assessment & Plan Note (Signed)
On statin.

## 2023-03-12 NOTE — Assessment & Plan Note (Signed)
A1c today 6.1% which is increased. Discussed decreasing sugar and carbs in the diet, especially sugary beverages.

## 2023-03-12 NOTE — Progress Notes (Signed)
Established Patient Office Visit  Subjective   Patient ID: Joel Clay, male    DOB: 07/01/1960  Age: 62 y.o. MRN: [id number]  Chief Complaint  Patient presents with   Medical Management of Chronic Issues    6 month follow up    HPI Patient is here for follow up on chronic medical conditions. He is new to me.   HLD/Aortic Atherosclerosis: -Medications: Lipitor 40 mg -Patient is compliant with above medications and reports no side effects.  -Last lipid panel: Lipid Panel     Component Value Date/Time   CHOL 211 (H) 05/26/2022 1352   TRIG 196 (H) 05/26/2022 1352   HDL 45 05/26/2022 1352   CHOLHDL 4.7 05/26/2022 1352   VLDL 24 03/10/2016 0955   LDLCALC 132 (H) 05/26/2022 1352   The 10-year ASCVD risk score (Arnett DK, et al., 2019) is: 14.4%   Values used to calculate the score:     Age: 62 years     Sex: Male     Is Non-Hispanic African American: Yes     Diabetic: No     Tobacco smoker: Yes     Systolic Blood Pressure: 128 mmHg     Is BP treated: No     HDL Cholesterol: 45 mg/dL     Total Cholesterol: 211 mg/dL  Pre-Diabetes: -U1L 2.4% 3/24 -Not on medication -Eats a lot of gummy bears, pasta, energy drinks and sugary beverages in general   Chronic Knee Pain: -Takes Ibuprofen 600 mg PRN a few times a week for general arthritis pain  Liver lesion first seen on CT scan from 2020 - recheck [place] back in July consistent with hemangioma.   Health Maintenance: -Blood work UTD -Colonoscopy 10/2013 - repeat in 10 years -Lung cancer screening due - last in 2022, Lung-RADS 2   Patient Active Problem List   Diagnosis Date Noted   Prediabetes 03/12/2023   Atherosclerosis of aorta (HCC) 03/12/2023   Intermittent low back pain 09/08/2022   Tobacco abuse counseling 02/01/2018   Mixed hyperlipidemia 02/01/2018   Mass of right hand 03/16/2017   Hyperglycemia 03/10/2016   Neck pain, chronic 12/23/2015   Past Medical History:  Diagnosis Date   Benign enlargement of  prostate    Elevated PSA    Epididymitis    GERD (gastroesophageal reflux disease)    tums prn   Hematuria, microscopic    History of kidney stones    h/o   History of urethral stricture    Testicular pain, left    Tobacco abuse    Past Surgical History:  Procedure Laterality Date   CYSTOSCOPY WITH URETHRAL DILATATION N/A 11/06/2018   Procedure: CYSTOSCOPY WITH URETHRAL DILATATION;  Surgeon: Riki Altes, MD;  Location: [facility] ORS;  Service: Urology;  Laterality: N/A;   NO PAST SURGERIES     Social History   Tobacco Use   Smoking status: Every Day    Current packs/day: 0.50    Average packs/day: 0.5 packs/day for 40.0 years (20.0 ttl pk-yrs)    Types: Cigarettes   Smokeless tobacco: Never   Tobacco comments:    referred   Vaping Use   Vaping status: Never Used  Substance Use Topics   Alcohol use: Yes    Alcohol/week: 0.0 standard drinks of alcohol    Comment: occasional   Drug use: No   Social History   Socioeconomic History   Marital status: Divorced    Spouse name: Not on file   Number of children: 3  Years of education: Not on file   Highest education level: Not on file  Occupational History   Occupation: rail road worker  Tobacco Use   Smoking status: Every Day    Current packs/day: 0.50    Average packs/day: 0.5 packs/day for 40.0 years (20.0 ttl pk-yrs)    Types: Cigarettes   Smokeless tobacco: Never   Tobacco comments:    referred   Vaping Use   Vaping status: Never Used  Substance and Sexual Activity   Alcohol use: Yes    Alcohol/week: 0.0 standard drinks of alcohol    Comment: occasional   Drug use: No   Sexual activity: Yes    Partners: Female  Other Topics Concern   Not on file  Social History Narrative   Works for [employer] - travels regularly.   Social Drivers of Corporate investment banker Strain: Low Risk  (05/26/2022)   Overall Financial Resource Strain (CARDIA)    Difficulty of Paying Living Expenses:  Not hard at all  Food Insecurity: No Food Insecurity (05/26/2022)   Hunger Vital Sign    Worried About Running Out of Food in the Last Year: Never true    Ran Out of Food in the Last Year: Never true  Transportation Needs: No Transportation Needs (05/26/2022)   PRAPARE - Administrator, Civil Service (Medical): No    Lack of Transportation (Non-Medical): No  Physical Activity: Sufficiently Active (05/26/2022)   Exercise Vital Sign    Days of Exercise per Week: 5 days    Minutes of Exercise per Session: 60 min  Stress: No Stress Concern Present (05/26/2022)   [employer] of Occupational Health - Occupational Stress Questionnaire    Feeling of Stress : Not at all  Social Connections: Socially Isolated (05/26/2022)   Social Connection and Isolation Panel [NHANES]    Frequency of Communication with Friends and Family: More than three times a week    Frequency of Social Gatherings with Friends and Family: More than three times a week    Attends Religious Services: Never    Database administrator or Organizations: No    Attends Banker Meetings: Never    Marital Status: Divorced  Catering manager Violence: Not At Risk (05/26/2022)   Humiliation, Afraid, Rape, and Kick questionnaire    Fear of Current or Ex-Partner: No    Emotionally Abused: No    Physically Abused: No    Sexually Abused: No   Family Status  Relation Name Status   MGM  (Not Specified)   Mother  Deceased   Father  Other       unknown history   MGF  (Not Specified)   Mat Aunt  Deceased   Mat Aunt  (Not Specified)  No partnership data on file   Family History  Problem Relation Age of Onset   Renal Disease Maternal Grandmother    Diabetes Maternal Grandmother    Diabetes Maternal Grandfather    Cancer Maternal Aunt        Unknown type   Diabetes Maternal Aunt    No Known Allergies    Review of Systems  All other systems reviewed and are negative.     Objective:     BP 128/72  (Cuff Size: Large)   Pulse 95   Temp 98.2 F (36.8 C) (Oral)   Resp 16   Ht 5\' 7"  (1.702 m)   Wt 171 lb 8 oz (77.8 kg)  SpO2 98%   BMI 26.86 kg/m  BP Readings from Last 3 Encounters:  03/12/23 128/72  09/08/22 126/68  05/26/22 118/72   Wt Readings from Last 3 Encounters:  03/12/23 171 lb 8 oz (77.8 kg)  09/08/22 172 lb (78 kg)  05/26/22 168 lb 14.4 oz (76.6 kg)      Physical Exam Constitutional:      Appearance: Normal appearance.  HENT:     Head: Normocephalic and atraumatic.  Eyes:     Conjunctiva/sclera: Conjunctivae normal.  Neck:     Comments: No thyromegaly  Cardiovascular:     Rate and Rhythm: Normal rate and regular rhythm.  Pulmonary:     Effort: Pulmonary effort is normal.     Breath sounds: Normal breath sounds.  Musculoskeletal:     Cervical back: No tenderness.  Lymphadenopathy:     Cervical: No cervical adenopathy.  Skin:    General: Skin is warm and dry.  Neurological:     General: No focal deficit present.     Mental Status: He is alert. Mental status is at baseline.  Psychiatric:        Mood and Affect: Mood normal.        Behavior: Behavior normal.      Results for orders placed or performed in visit on 03/12/23  POCT HgB A1C  Result Value Ref Range   Hemoglobin A1C 6.1 (A) 4.0 - 5.6 %   HbA1c POC (<> result, manual entry)     HbA1c, POC (prediabetic range)     HbA1c, POC (controlled diabetic range)      Last CBC Lab Results  Component Value Date   WBC 8.3 05/26/2022   HGB 14.5 05/26/2022   HCT 43.2 05/26/2022   MCV 88.0 05/26/2022   MCH 29.5 05/26/2022   RDW 13.6 05/26/2022   PLT 308 05/26/2022   Last metabolic panel Lab Results  Component Value Date   GLUCOSE 89 05/26/2022   NA 142 05/26/2022   K 4.6 05/26/2022   CL 105 05/26/2022   CO2 28 05/26/2022   BUN 20 05/26/2022   CREATININE 1.11 05/26/2022   EGFR 76 05/26/2022   CALCIUM 9.3 05/26/2022   PROT 6.7 05/26/2022   ALBUMIN 4.1 03/10/2016   BILITOT 0.4  05/26/2022   ALKPHOS 57 03/10/2016   AST 18 05/26/2022   ALT 24 05/26/2022   ANIONGAP 12 07/16/2015   Last lipids Lab Results  Component Value Date   CHOL 211 (H) 05/26/2022   HDL 45 05/26/2022   LDLCALC 132 (H) 05/26/2022   TRIG 196 (H) 05/26/2022   CHOLHDL 4.7 05/26/2022   Last hemoglobin A1c Lab Results  Component Value Date   HGBA1C 6.1 (A) 03/12/2023   Last thyroid functions Lab Results  Component Value Date   TSH 0.76 03/16/2017   Last vitamin D Lab Results  Component Value Date   VD25OH 10 (L) 03/16/2017   Last vitamin B12 and Folate No results found for: "VITAMINB12", "FOLATE"    The 10-year ASCVD risk score (Arnett DK, et al., 2019) is: 14.4%    Assessment & Plan:  Mixed hyperlipidemia Assessment & Plan: Reviewed cholesterol panel from March with the patient - he is wanting to discontinue statin but I recommend against this. Discussed LDL and ASCVD risk elevated and continuing the statin will reduce this risk.    Atherosclerosis of aorta [facility]) Assessment & Plan: On statin.    Prediabetes Assessment & Plan: A1c today 6.1% which is increased. Discussed decreasing sugar  and carbs in the diet, especially sugary beverages.   Orders: -     POCT glycosylated hemoglobin (Hb A1C)  Chronic pain of right knee -     Ibuprofen; Take 1 tablet (600 mg total) by mouth every 8 (eight) hours as needed for moderate pain (pain score 4-6).  Dispense: 100 tablet; Refill: 0  Lung cancer screening declined by patient -     Ambulatory Referral for Lung Cancer Scre  Will refill Ibuprofen for pain, discussed taking with food and will return in March for physical and to recheck kidney function.   Return for already scheduled.    Margarita Mail, DO

## 2023-03-12 NOTE — Assessment & Plan Note (Signed)
Reviewed cholesterol panel from March with the patient - he is wanting to discontinue statin but I recommend against this. Discussed LDL and ASCVD risk elevated and continuing the statin will reduce this risk.

## 2023-03-21 ENCOUNTER — Encounter: Payer: Self-pay | Admitting: Acute Care

## 2023-05-30 ENCOUNTER — Encounter: Payer: Self-pay | Admitting: Family Medicine

## 2023-05-30 ENCOUNTER — Other Ambulatory Visit: Payer: Self-pay

## 2023-05-30 ENCOUNTER — Ambulatory Visit (INDEPENDENT_AMBULATORY_CARE_PROVIDER_SITE_OTHER): Admitting: Family Medicine

## 2023-05-30 VITALS — BP 124/76 | HR 80 | Temp 97.9°F | Resp 16 | Ht 67.0 in | Wt 170.6 lb

## 2023-05-30 DIAGNOSIS — Z87891 Personal history of nicotine dependence: Secondary | ICD-10-CM

## 2023-05-30 DIAGNOSIS — Z0001 Encounter for general adult medical examination with abnormal findings: Secondary | ICD-10-CM

## 2023-05-30 DIAGNOSIS — I700 Atherosclerosis of aorta: Secondary | ICD-10-CM

## 2023-05-30 DIAGNOSIS — F17210 Nicotine dependence, cigarettes, uncomplicated: Secondary | ICD-10-CM

## 2023-05-30 DIAGNOSIS — Z125 Encounter for screening for malignant neoplasm of prostate: Secondary | ICD-10-CM

## 2023-05-30 DIAGNOSIS — N521 Erectile dysfunction due to diseases classified elsewhere: Secondary | ICD-10-CM

## 2023-05-30 DIAGNOSIS — Z122 Encounter for screening for malignant neoplasm of respiratory organs: Secondary | ICD-10-CM

## 2023-05-30 DIAGNOSIS — Z1211 Encounter for screening for malignant neoplasm of colon: Secondary | ICD-10-CM

## 2023-05-30 DIAGNOSIS — Z23 Encounter for immunization: Secondary | ICD-10-CM | POA: Diagnosis not present

## 2023-05-30 DIAGNOSIS — Z0000 Encounter for general adult medical examination without abnormal findings: Secondary | ICD-10-CM

## 2023-05-30 DIAGNOSIS — E782 Mixed hyperlipidemia: Secondary | ICD-10-CM | POA: Diagnosis not present

## 2023-05-30 DIAGNOSIS — Z720 Tobacco use: Secondary | ICD-10-CM

## 2023-05-30 DIAGNOSIS — Z130 Encounter for screening for diseases of the blood and blood-forming organs and certain disorders involving the immune mechanism: Secondary | ICD-10-CM

## 2023-05-30 MED ORDER — TADALAFIL 10 MG PO TABS: 10.0000 mg | ORAL_TABLET | ORAL | 0 refills | Status: DC | PRN

## 2023-05-30 NOTE — Progress Notes (Unsigned)
 Name: Joel Clay   MRN: [id number]    DOB: [birth year]   Date:05/31/2023       Progress Note  Subjective  Chief Complaint  Chief Complaint  Patient presents with   Annual Exam    HPI  Patient presents for annual CPE .   Diet: Regular - pack his food  Exercise: 6 days a week 1 hour Last Dental Exam:completed Last Eye Exam: 2023   IPSS Questionnaire (AUA-7): Over the past month.   1)  How often have you had a sensation of not emptying your bladder completely after you finish urinating?  0 - Not at all  2)  How often have you had to urinate again less than two hours after you finished urinating? 0 - Not at all  3)  How often have you found you stopped and started again several times when you urinated?  0 - Not at all  4) How difficult have you found it to postpone urination?  0 - Not at all  5) How often have you had a weak urinary stream?  0 - Not at all  6) How often have you had to push or strain to begin urination?  0 - Not at all  7) How many times did you most typically get up to urinate from the time you went to bed until the time you got up in the morning?  1 - 1 time  Total score:  0-7 mildly symptomatic   8-19 moderately symptomatic   20-35 severely symptomatic    Depression: phq 9 is negative    05/30/2023    1:19 PM 03/12/2023    1:07 PM 09/08/2022    1:00 PM 05/26/2022    1:34 PM 05/20/2021    1:04 PM  Depression screen PHQ 2/9  Decreased Interest 0 0 0 0 0  Down, Depressed, Hopeless 0 0 0 0 0  PHQ - 2 Score 0 0 0 0 0  Altered sleeping   0  0  Tired, decreased energy   0  0  Change in appetite   0  0  Feeling bad or failure about yourself    0  0  Trouble concentrating   0  0  Moving slowly or fidgety/restless   0  0  Suicidal thoughts   0  0  PHQ-9 Score   0  0  Difficult doing work/chores     Not difficult at all    Hypertension:  BP Readings from Last 3 Encounters:  05/30/23 124/76  03/12/23 128/72  09/08/22 126/68    Obesity: Wt Readings  from Last 3 Encounters:  05/30/23 170 lb 9.6 oz (77.4 kg)  03/12/23 171 lb 8 oz (77.8 kg)  09/08/22 172 lb (78 kg)   BMI Readings from Last 3 Encounters:  05/30/23 26.72 kg/m  03/12/23 26.86 kg/m  09/08/22 26.94 kg/m     Flowsheet Row Office Visit from 05/30/2023 in [facility]  AUDIT-C Score 0       Divorced STD testing and prevention (HIV/chl/gon/syphilis):  no Sexual history: one partner for the past 6 years , he has difficulty maintaining an erection  Hep C Screening: completed Skin cancer: Discussed monitoring for atypical lesions Colorectal cancer: will schedule Prostate cancer:  yes Lab Results  Component Value Date   PSA 0.78 05/30/2023   PSA 0.70 05/26/2022   PSA 0.84 05/20/2021     Lung cancer:  Low Dose CT Chest recommended if Age 9-80 years,  30 pack-year currently smoking OR have quit w/in 15years. Patient  is a candidate    AAA: The USPSTF recommends one-time screening with ultrasonography in men ages 34 to 23 years who have ever smoked. Patient is a candidate for screening  ECG:  we will check EKG next visit   Vaccines: reviewed with the patient.   Advanced Care Planning: A voluntary discussion about advance care planning including the explanation and discussion of advance directives.  Discussed health care proxy and Living will, and the patient was able to identify a health care proxy as Perlie Mayo (aunt) .  Patient does not have a living will and power of attorney of health care   Patient Active Problem List   Diagnosis Date Noted   Prediabetes 03/12/2023   Atherosclerosis of aorta (HCC) 03/12/2023   Intermittent low back pain 09/08/2022   Tobacco abuse counseling 02/01/2018   Mixed hyperlipidemia 02/01/2018   Mass of right hand 03/16/2017   Hyperglycemia 03/10/2016   Neck pain, chronic 12/23/2015    Past Surgical History:  Procedure Laterality Date   CYSTOSCOPY WITH URETHRAL DILATATION N/A 11/06/2018   Procedure:  CYSTOSCOPY WITH URETHRAL DILATATION;  Surgeon: Riki Altes, MD;  Location: [facility] ORS;  Service: Urology;  Laterality: N/A;   NO PAST SURGERIES      Family History  Problem Relation Age of Onset   Renal Disease Maternal Grandmother    Diabetes Maternal Grandmother    Diabetes Maternal Grandfather    Cancer Maternal Aunt        Unknown type   Diabetes Maternal Aunt     Social History   Socioeconomic History   Marital status: Divorced    Spouse name: Not on file   Number of children: 3   Years of education: Not on file   Highest education level: Not on file  Occupational History   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, occupational  Tobacco Use   Smoking status: Every Day    Current packs/day: 0.50    Average packs/day: 0.5 packs/day for 44.0 years (22.0 ttl pk-yrs)    Types: Cigarettes    Start date: 05/27/1979   Smokeless tobacco: Never   Tobacco comments:    referred   Vaping Use   Vaping status: Never Used  Substance and Sexual Activity   Alcohol use: Yes    Alcohol/week: 0.0 standard drinks of alcohol    Comment: occasional   Drug use: No   Sexual activity: Yes    Partners: Female  Other Topics Concern   Not on file  Social History Narrative   Works for [employer] - travels regularly.   Social Drivers of Corporate investment banker Strain: Low Risk  (05/30/2023)   Overall Financial Resource Strain (CARDIA)    Difficulty of Paying Living Expenses: Not hard at all  Food Insecurity: No Food Insecurity (05/30/2023)   Hunger Vital Sign    Worried About Running Out of Food in the Last Year: Never true    Ran Out of Food in the Last Year: Never true  Transportation Needs: No Transportation Needs (05/30/2023)   PRAPARE - Administrator, Civil Service (Medical): No    Lack of Transportation (Non-Medical): No  Physical Activity: Sufficiently Active (05/30/2023)   Exercise Vital Sign    Days of Exercise per Week: 5 days    Minutes of Exercise per  Session: 60 min  Stress: No Stress Concern Present (05/30/2023)   [employer] of Occupational Health -  Occupational Stress Questionnaire    Feeling of Stress : Only a little  Social Connections: Socially Isolated (05/30/2023)   Social Connection and Isolation Panel [NHANES]    Frequency of Communication with Friends and Family: More than three times a week    Frequency of Social Gatherings with Friends and Family: More than three times a week    Attends Religious Services: Never    Database administrator or Organizations: No    Attends Banker Meetings: Never    Marital Status: Divorced  Catering manager Violence: Not At Risk (05/30/2023)   Humiliation, Afraid, Rape, and Kick questionnaire    Fear of Current or Ex-Partner: No    Emotionally Abused: No    Physically Abused: No    Sexually Abused: No     Current Outpatient Medications:    atorvastatin (LIPITOR) 40 MG tablet, Take 1 tablet (40 mg total) by mouth daily., Disp: 90 tablet, Rfl: 3   ibuprofen (ADVIL) 600 MG tablet, Take 1 tablet (600 mg total) by mouth every 8 (eight) hours as needed for moderate pain (pain score 4-6)., Disp: 100 tablet, Rfl: 0   tadalafil (CIALIS) 10 MG tablet, Take 1 tablet (10 mg total) by mouth every other day as needed for erectile dysfunction., Disp: 30 tablet, Rfl: 0  No Known Allergies   ROS  Constitutional: Negative for fever or weight change.  Respiratory: Negative for cough and shortness of breath.   Cardiovascular: Negative for chest pain or palpitations.  Gastrointestinal: Negative for abdominal pain, no bowel changes.  Musculoskeletal: Negative for gait problem or joint swelling.  Skin: Negative for rash.  Neurological: Negative for dizziness or headache.  No other specific complaints in a complete review of systems (except as listed in HPI above).    Objective  Vitals:   05/30/23 1319  BP: 124/76  Pulse: 80  Resp: 16  Temp: 97.9 F (36.6 C)  TempSrc: Oral   SpO2: 96%  Weight: 170 lb 9.6 oz (77.4 kg)  Height: 5\' 7"  (1.702 m)    Body mass index is 26.72 kg/m.  Physical Exam  Constitutional: Patient appears well-developed and well-nourished. No distress.  HENT: Head: Normocephalic and atraumatic. Ears: B TMs ok, no erythema or effusion; Nose: Nose normal. Mouth/Throat: Oropharynx is clear and moist. No oropharyngeal exudate.  Eyes: Conjunctivae and EOM are normal. Pupils are equal, round, and reactive to light. No scleral icterus.  Neck: Normal range of motion. Neck supple. No JVD present. No thyromegaly present.  Cardiovascular: Normal rate, regular rhythm and normal heart sounds.  No murmur heard. No BLE edema. Pulmonary/Chest: Effort normal and breath sounds normal. No respiratory distress. Abdominal: Soft. Bowel sounds are normal, no distension. There is no tenderness. no masses MALE GENITALIA: Normal descended testes bilaterally, no masses palpated, no hernias, no lesions, no discharge RECTAL: not done  Musculoskeletal: Normal range of motion, no joint effusions. No gross deformities Neurological: he is alert and oriented to person, place, and time. No cranial nerve deficit. Coordination, balance, strength, speech and gait are normal.  Skin: Skin is warm and dry. No rash noted. No erythema.  Psychiatric: Patient has a normal mood and affect. behavior is normal. Judgment and thought content normal.     Assessment & Plan  1. Well adult exam (Primary)  - Ambulatory referral to Gastroenterology - Lipid panel - CBC with Differential/Platelet - COMPLETE METABOLIC PANEL WITH GFR - PSA  2. Colon cancer screening  - Ambulatory referral to Gastroenterology  3. Atherosclerosis  of aorta (HCC)  - Lipid panel  4. Mixed hyperlipidemia  - Lipid panel  5. Screening for deficiency anemia  - CBC with Differential/Platelet  6. Prostate cancer screening  - PSA  7. Need for vaccination with 20-polyvalent pneumococcal conjugate  vaccine  - Pneumococcal conjugate vaccine 20-valent (Prevnar 20)  8. Encounter for screening for lung cancer  - Ambulatory Referral Lung Cancer Screening [facility]  9. Tobacco use  - Ambulatory Referral Lung Cancer Screening [facility]  10. Erectile dysfunction due to diseases classified elsewhere  - tadalafil (CIALIS) 10 MG tablet; Take 1 tablet (10 mg total) by mouth every other day as needed for erectile dysfunction.  Dispense: 30 tablet; Refill: 0    -Prostate cancer screening and PSA options (with potential risks and benefits of testing vs not testing) were discussed along with recent recs/guidelines. -USPSTF grade A and B recommendations reviewed with patient; age-appropriate recommendations, preventive care, screening tests, etc discussed and encouraged; healthy living encouraged; see AVS for patient education given to patient -Discussed importance of 150 minutes of physical activity weekly, eat two servings of fish weekly, eat one serving of tree nuts ( cashews, pistachios, pecans, almonds.Marland Kitchen) every other day, eat 6 servings of fruit/vegetables daily and drink plenty of water and avoid sweet beverages.  -Reviewed Health Maintenance: yes

## 2023-05-31 ENCOUNTER — Encounter: Payer: Self-pay | Admitting: Family Medicine

## 2023-05-31 LAB — COMPLETE METABOLIC PANEL WITH GFR
AG Ratio: 2.3 (calc) (ref 1.0–2.5)
ALT: 46 U/L (ref 9–46)
AST: 22 U/L (ref 10–35)
Albumin: 4.6 g/dL (ref 3.6–5.1)
Alkaline phosphatase (APISO): 58 U/L (ref 35–144)
BUN: 15 mg/dL (ref 7–25)
CO2: 32 mmol/L (ref 20–32)
Calcium: 9.4 mg/dL (ref 8.6–10.3)
Chloride: 103 mmol/L (ref 98–110)
Creat: 1.01 mg/dL (ref 0.70–1.35)
Globulin: 2 g/dL (ref 1.9–3.7)
Glucose, Bld: 90 mg/dL (ref 65–99)
Potassium: 4 mmol/L (ref 3.5–5.3)
Sodium: 141 mmol/L (ref 135–146)
Total Bilirubin: 0.4 mg/dL (ref 0.2–1.2)
Total Protein: 6.6 g/dL (ref 6.1–8.1)

## 2023-05-31 LAB — CBC WITH DIFFERENTIAL/PLATELET
Absolute Lymphocytes: 2074 {cells}/uL (ref 850–3900)
Absolute Monocytes: 403 {cells}/uL (ref 200–950)
Basophils Absolute: 58 {cells}/uL (ref 0–200)
Basophils Relative: 0.9 %
Eosinophils Absolute: 128 {cells}/uL (ref 15–500)
Eosinophils Relative: 2 %
HCT: 44.3 % (ref 38.5–50.0)
Hemoglobin: 14.4 g/dL (ref 13.2–17.1)
MCH: 29.1 pg (ref 27.0–33.0)
MCHC: 32.5 g/dL (ref 32.0–36.0)
MCV: 89.5 fL (ref 80.0–100.0)
MPV: 11.7 fL (ref 7.5–12.5)
Monocytes Relative: 6.3 %
Neutro Abs: 3738 {cells}/uL (ref 1500–7800)
Neutrophils Relative %: 58.4 %
Platelets: 270 10*3/uL (ref 140–400)
RBC: 4.95 10*6/uL (ref 4.20–5.80)
RDW: 13.4 % (ref 11.0–15.0)
Total Lymphocyte: 32.4 %
WBC: 6.4 10*3/uL (ref 3.8–10.8)

## 2023-05-31 LAB — LIPID PANEL
Cholesterol: 181 mg/dL (ref <200)
HDL: 60 mg/dL (ref >=40)
LDL Cholesterol (Calc): 102 mg/dL — ABNORMAL HIGH
Non-HDL Cholesterol (Calc): 121 mg/dL (ref <130)
Total CHOL/HDL Ratio: 3 (calc) (ref <5.0)
Triglycerides: 99 mg/dL (ref <150)

## 2023-05-31 LAB — PSA: PSA: 0.78 ng/mL (ref <=4.00)

## 2023-06-15 ENCOUNTER — Ambulatory Visit
Admission: RE | Admit: 2023-06-15 | Discharge: 2023-06-15 | Disposition: A | Discharge status: Home / Self Care | Source: Ambulatory Visit | Attending: Acute Care | Admitting: Acute Care

## 2023-06-15 DIAGNOSIS — Z87891 Personal history of nicotine dependence: Secondary | ICD-10-CM | POA: Diagnosis present

## 2023-06-15 DIAGNOSIS — I700 Atherosclerosis of aorta: Secondary | ICD-10-CM | POA: Diagnosis not present

## 2023-06-15 DIAGNOSIS — J439 Emphysema, unspecified: Secondary | ICD-10-CM | POA: Diagnosis not present

## 2023-06-15 DIAGNOSIS — Z122 Encounter for screening for malignant neoplasm of respiratory organs: Secondary | ICD-10-CM | POA: Diagnosis present

## 2023-06-15 DIAGNOSIS — N200 Calculus of kidney: Secondary | ICD-10-CM | POA: Diagnosis not present

## 2023-06-15 DIAGNOSIS — F17210 Nicotine dependence, cigarettes, uncomplicated: Secondary | ICD-10-CM | POA: Insufficient documentation

## 2023-07-20 ENCOUNTER — Other Ambulatory Visit: Payer: Self-pay

## 2023-07-20 DIAGNOSIS — Z87891 Personal history of nicotine dependence: Secondary | ICD-10-CM

## 2023-07-20 DIAGNOSIS — Z122 Encounter for screening for malignant neoplasm of respiratory organs: Secondary | ICD-10-CM

## 2023-07-20 DIAGNOSIS — F17210 Nicotine dependence, cigarettes, uncomplicated: Secondary | ICD-10-CM

## 2023-08-24 ENCOUNTER — Encounter: Payer: Self-pay | Admitting: Family Medicine

## 2023-08-24 ENCOUNTER — Ambulatory Visit: Admitting: Family Medicine

## 2023-08-24 VITALS — BP 122/74 | HR 82 | Temp 98.0°F | Ht 67.0 in | Wt 170.5 lb

## 2023-08-24 DIAGNOSIS — G8929 Other chronic pain: Secondary | ICD-10-CM

## 2023-08-24 DIAGNOSIS — R252 Cramp and spasm: Secondary | ICD-10-CM | POA: Diagnosis not present

## 2023-08-24 DIAGNOSIS — M2550 Pain in unspecified joint: Secondary | ICD-10-CM | POA: Diagnosis not present

## 2023-08-24 DIAGNOSIS — M791 Myalgia, unspecified site: Secondary | ICD-10-CM

## 2023-08-24 DIAGNOSIS — R519 Headache, unspecified: Secondary | ICD-10-CM

## 2023-08-24 DIAGNOSIS — M25561 Pain in right knee: Secondary | ICD-10-CM

## 2023-08-24 MED ORDER — SLOW MAGNESIUM/CALCIUM 64-106 MG PO TBEC: 1.0000 | DELAYED_RELEASE_TABLET | Freq: Every day | ORAL | 1 refills | Status: DC

## 2023-08-24 MED ORDER — IBUPROFEN 600 MG PO TABS
600.0000 mg | ORAL_TABLET | Freq: Three times a day (TID) | ORAL | 2 refills | Status: AC | PRN
Start: 2023-08-24 — End: ?

## 2023-08-24 NOTE — Patient Instructions (Signed)
 Some of your hydration should include electrolytes and minerals-  The Big Four Electrolytes: Sodium, Potassium, Calcium , and Magnesium. Each of these electrolytes and minerals plays a specific role in hydration and bodily function  I have refilled the ibuprofen  - try not to take daily or more than 10 days a month - it can hurt kidney's stomach or cause rebound headaches  Please come in and complete the labs if your symptoms are not improving and if you headache continues for another 1-2 weeks please let me know we would need to see you again to further evaluate this

## 2023-08-24 NOTE — Progress Notes (Unsigned)
 Patient ID: Joel Clay, male    DOB: 17-Apr-1960, 63 y.o.   MRN: [id number]  PCP: Adeline Hone, PA-C  Chief Complaint  Patient presents with   Generalized Body Aches    Onset 1 wk, all over, Patient describes as soreness    Headache    Onset 1 wk, states he wakes in the morning with a headache  Please go over lung cancer screening results    Subjective:   Joel Clay is a 63 y.o. male, presents to clinic with CC of the following:  HPI  Body aches joint pain for at least a week, very physical work/labor last night he had back cramp to his right calf Joints everywhere hurt he denies any redness or swelling, no new acute joint issues, he just states he's 62  HA is located to bilateral sides of head, pressure onset about a week ago and then it goes away throughout the day  No associated vision changes, neck pain   Patient Active Problem List   Diagnosis Date Noted   Prediabetes 03/12/2023   Atherosclerosis of aorta (HCC) 03/12/2023   Intermittent low back pain 09/08/2022   Tobacco abuse counseling 02/01/2018   Mixed hyperlipidemia 02/01/2018   Mass of right hand 03/16/2017   Hyperglycemia 03/10/2016   Neck pain, chronic 12/23/2015      Current Outpatient Medications:    ibuprofen  (ADVIL ) 600 MG tablet, Take 1 tablet (600 mg total) by mouth every 8 (eight) hours as needed for moderate pain (pain score 4-6)., Disp: 100 tablet, Rfl: 0   atorvastatin  (LIPITOR) 40 MG tablet, Take 1 tablet (40 mg total) by mouth daily. (Patient not taking: Reported on 08/24/2023), Disp: 90 tablet, Rfl: 3   tadalafil  (CIALIS ) 10 MG tablet, Take 1 tablet (10 mg total) by mouth every other day as needed for erectile dysfunction. (Patient not taking: Reported on 08/24/2023), Disp: 30 tablet, Rfl: 0   No Known Allergies   Social History   Tobacco Use   Smoking status: Every Day    Current packs/day: 0.50    Average packs/day: 0.5 packs/day for 44.2 years (22.1 ttl pk-yrs)    Types:  Cigarettes    Start date: 05/27/1979   Smokeless tobacco: Never   Tobacco comments:    referred   Vaping Use   Vaping status: Never Used  Substance Use Topics   Alcohol use: Yes    Alcohol/week: 0.0 standard drinks of alcohol    Comment: occasional   Drug use: No      Chart Review Today: I personally reviewed active problem list, medication list, allergies, family history, social history, health maintenance, notes from last encounter, lab results, imaging with the patient/caregiver today.   Review of Systems  Constitutional: Negative.   HENT: Negative.    Eyes: Negative.   Respiratory: Negative.    Cardiovascular: Negative.   Gastrointestinal: Negative.   Endocrine: Negative.   Genitourinary: Negative.   Musculoskeletal: Negative.   Skin: Negative.   Allergic/Immunologic: Negative.   Neurological: Negative.   Hematological: Negative.   Psychiatric/Behavioral: Negative.    All other systems reviewed and are negative.      Objective:   Vitals:   08/24/23 1303  BP: 122/74  Pulse: 82  Temp: 98 F (36.7 C)  TempSrc: Oral  SpO2: 99%  Weight: 170 lb 8 oz (77.3 kg)  Height: 5' 7 (1.702 m)    Body mass index is 26.7 kg/m.  Physical Exam Vitals and nursing note reviewed.  Constitutional:  General: He is not in acute distress.    Appearance: Normal appearance. He is well-developed. He is not ill-appearing, toxic-appearing or diaphoretic.  HENT:     Head: Normocephalic and atraumatic.     Right Ear: External ear normal.     Left Ear: External ear normal.     Nose: Nose normal.   Eyes:     General: No scleral icterus.       Right eye: No discharge.        Left eye: No discharge.     Conjunctiva/sclera: Conjunctivae normal.   Neck:     Trachea: No tracheal deviation.   Cardiovascular:     Rate and Rhythm: Normal rate.  Pulmonary:     Effort: Pulmonary effort is normal. No respiratory distress.     Breath sounds: No stridor.   Skin:    General:  Skin is warm and dry.     Findings: No rash.   Neurological:     Mental Status: He is alert.     Motor: No abnormal muscle tone.     Coordination: Coordination normal.     Gait: Gait normal.   Psychiatric:        Mood and Affect: Mood normal.        Behavior: Behavior normal.      Results for orders placed or performed in visit on 05/30/23  Lipid panel   Collection Time: 05/30/23  2:06 PM  Result Value Ref Range   Cholesterol 181 <200 mg/dL   HDL 60 > OR = 40 mg/dL   Triglycerides 99 <160 mg/dL   LDL Cholesterol (Calc) 102 (H) mg/dL (calc)   Total CHOL/HDL Ratio 3.0 <5.0 (calc)   Non-HDL Cholesterol (Calc) 121 <130 mg/dL (calc)  CBC with Differential/Platelet   Collection Time: 05/30/23  2:06 PM  Result Value Ref Range   WBC 6.4 3.8 - 10.8 Thousand/uL   RBC 4.95 4.20 - 5.80 Million/uL   Hemoglobin 14.4 13.2 - 17.1 g/dL   HCT 73.7 10.6 - 26.9 %   MCV 89.5 80.0 - 100.0 fL   MCH 29.1 27.0 - 33.0 pg   MCHC 32.5 32.0 - 36.0 g/dL   RDW 48.5 46.2 - 70.3 %   Platelets 270 140 - 400 Thousand/uL   MPV 11.7 7.5 - 12.5 fL   Neutro Abs 3,738 1,500 - 7,800 cells/uL   Absolute Lymphocytes 2,074 850 - 3,900 cells/uL   Absolute Monocytes 403 200 - 950 cells/uL   Eosinophils Absolute 128 15 - 500 cells/uL   Basophils Absolute 58 0 - 200 cells/uL   Neutrophils Relative % 58.4 %   Total Lymphocyte 32.4 %   Monocytes Relative 6.3 %   Eosinophils Relative 2.0 %   Basophils Relative 0.9 %  COMPLETE METABOLIC PANEL WITH GFR   Collection Time: 05/30/23  2:06 PM  Result Value Ref Range   Glucose, Bld 90 65 - 99 mg/dL   BUN 15 7 - 25 mg/dL   Creat 5.00 9.38 - 1.82 mg/dL   BUN/Creatinine Ratio SEE NOTE: 6 - 22 (calc)   Sodium 141 135 - 146 mmol/L   Potassium 4.0 3.5 - 5.3 mmol/L   Chloride 103 98 - 110 mmol/L   CO2 32 20 - 32 mmol/L   Calcium  9.4 8.6 - 10.3 mg/dL   Total Protein 6.6 6.1 - 8.1 g/dL   Albumin 4.6 3.6 - 5.1 g/dL   Globulin 2.0 1.9 - 3.7 g/dL (calc)   AG Ratio 2.3 1.0  -  2.5 (calc)   Total Bilirubin 0.4 0.2 - 1.2 mg/dL   Alkaline phosphatase (APISO) 58 35 - 144 U/L   AST 22 10 - 35 U/L   ALT 46 9 - 46 U/L  PSA   Collection Time: 05/30/23  2:06 PM  Result Value Ref Range   PSA 0.78 < OR = 4.00 ng/mL       Assessment & Plan:     ICD-10-CM   1. Myalgia  M79.10 CK    Magnesium    Basic Metabolic Panel Without GFR    Magnesium Chloride-Calcium  (SLOW MAGNESIUM/CALCIUM ) 64-106 MG TBEC    2. Polyarthralgia  M25.50     3. Muscle cramp  R25.2 CK    Magnesium    Basic Metabolic Panel Without GFR    Magnesium Chloride-Calcium  (SLOW MAGNESIUM/CALCIUM ) 64-106 MG TBEC    4. Nonintractable headache, unspecified chronicity pattern, unspecified headache type  R51.9 Magnesium Chloride-Calcium  (SLOW MAGNESIUM/CALCIUM ) 64-106 MG TBEC   onset 2 weeks ago present in am upon waking, HA to both sides of head/tightnes/pressure    5. Chronic pain of right knee  M25.561 ibuprofen  (ADVIL ) 600 MG tablet   G89.29           Adeline Hone, PA-C 08/24/23 1:24 PM

## 2023-08-31 ENCOUNTER — Encounter: Payer: Self-pay | Admitting: Family Medicine

## 2023-08-31 ENCOUNTER — Ambulatory Visit: Payer: Self-pay | Admitting: Family Medicine

## 2023-08-31 ENCOUNTER — Ambulatory Visit: Admitting: Family Medicine

## 2023-08-31 VITALS — BP 122/80 | HR 83 | Resp 16 | Ht 67.0 in | Wt 170.0 lb

## 2023-08-31 DIAGNOSIS — J438 Other emphysema: Secondary | ICD-10-CM | POA: Insufficient documentation

## 2023-08-31 DIAGNOSIS — R7303 Prediabetes: Secondary | ICD-10-CM

## 2023-08-31 DIAGNOSIS — I700 Atherosclerosis of aorta: Secondary | ICD-10-CM

## 2023-08-31 DIAGNOSIS — N521 Erectile dysfunction due to diseases classified elsewhere: Secondary | ICD-10-CM | POA: Insufficient documentation

## 2023-08-31 DIAGNOSIS — E782 Mixed hyperlipidemia: Secondary | ICD-10-CM

## 2023-08-31 DIAGNOSIS — M545 Low back pain, unspecified: Secondary | ICD-10-CM

## 2023-08-31 DIAGNOSIS — F17200 Nicotine dependence, unspecified, uncomplicated: Secondary | ICD-10-CM | POA: Insufficient documentation

## 2023-08-31 DIAGNOSIS — G8929 Other chronic pain: Secondary | ICD-10-CM

## 2023-08-31 DIAGNOSIS — M542 Cervicalgia: Secondary | ICD-10-CM

## 2023-08-31 LAB — POCT GLYCOSYLATED HEMOGLOBIN (HGB A1C): Hemoglobin A1C: 5.7 % — AB (ref 4.0–5.6)

## 2023-08-31 MED ORDER — ATORVASTATIN CALCIUM 40 MG PO TABS: 40.0000 mg | ORAL_TABLET | Freq: Every day | ORAL | 3 refills | Status: AC

## 2023-08-31 MED ORDER — TADALAFIL 20 MG PO TABS: 20.0000 mg | ORAL_TABLET | Freq: Every day | ORAL | 11 refills | Status: AC | PRN

## 2023-08-31 NOTE — Assessment & Plan Note (Signed)
 Reviewed screening CT done in April he denies any sx and did not wish to consult pulm or discuss smoking cessation

## 2023-08-31 NOTE — Progress Notes (Signed)
 Name: Laquinton Bihm   MRN: [id number]    DOB: 1960-11-26   Date:08/31/2023       Progress Note  Chief Complaint  Patient presents with   Medical Management of Chronic Issues    3 month follow-up   Hyperlipidemia   Prediabetes     Subjective:   Joel Clay is a 63 y.o. male, presents to clinic for routine follow up on chronic conditions  Prediabetes- recommended her recheck labs today and then can recheck at his next appt/CPE which is scheduled in March Lab Results  Component Value Date   HGBA1C 6.1 (A) 03/12/2023   HLD on lipitor 40- pt states he has several bottles at home, he has last Rx from a year ago so likely noncompliant and labs were done a few months ago.  He reports he just restarted taking it Lab Results  Component Value Date   CHOL 181 05/30/2023   HDL 60 05/30/2023   LDLCALC 102 (H) 05/30/2023   TRIG 99 05/30/2023   CHOLHDL 3.0 05/30/2023   ED he is prescribed cialis  10 he doesn't know if it works for him He asks for higher dose   Current Outpatient Medications:    atorvastatin  (LIPITOR) 40 MG tablet, Take 1 tablet (40 mg total) by mouth daily., Disp: 90 tablet, Rfl: 3   ibuprofen  (ADVIL ) 600 MG tablet, Take 1 tablet (600 mg total) by mouth every 8 (eight) hours as needed for moderate pain (pain score 4-6) or headache. Do not use for more than 3 d in a row or more than 10 days in a month, Disp: 30 tablet, Rfl: 2   tadalafil  (CIALIS ) 10 MG tablet, Take 1 tablet (10 mg total) by mouth every other day as needed for erectile dysfunction., Disp: 30 tablet, Rfl: 0   Magnesium Chloride-Calcium  (SLOW MAGNESIUM/CALCIUM ) 64-106 MG TBEC, Take 1 tablet by mouth daily. (Patient not taking: Reported on 08/31/2023), Disp: 30 tablet, Rfl: 1  Patient Active Problem List   Diagnosis Date Noted   Prediabetes 03/12/2023   Atherosclerosis of aorta (HCC) 03/12/2023   Intermittent low back pain 09/08/2022   Tobacco abuse counseling 02/01/2018   Mixed hyperlipidemia 02/01/2018    Mass of right hand 03/16/2017   Hyperglycemia 03/10/2016   Neck pain, chronic 12/23/2015    Past Surgical History:  Procedure Laterality Date   CYSTOSCOPY WITH URETHRAL DILATATION N/A 11/06/2018   Procedure: CYSTOSCOPY WITH URETHRAL DILATATION;  Surgeon: Geraline Knapp, MD;  Location: [facility];  Service: Urology;  Laterality: N/A;   NO PAST SURGERIES      Family History  Problem Relation Age of Onset   Renal Disease Maternal Grandmother    Diabetes Maternal Grandmother    Diabetes Maternal Grandfather    Cancer Maternal Aunt        Unknown type   Diabetes Maternal Aunt     Social History   Tobacco Use   Smoking status: Every Day    Current packs/day: 0.50    Average packs/day: 0.5 packs/day for 44.3 years (22.1 ttl pk-yrs)    Types: Cigarettes    Start date: 05/27/1979   Smokeless tobacco: Never   Tobacco comments:    referred   Vaping Use   Vaping status: Never Used  Substance Use Topics   Alcohol use: Yes    Alcohol/week: 0.0 standard drinks of alcohol    Comment: occasional   Drug use: No     No Known Allergies  Health Maintenance  Topic Date Due  COVID-19 Vaccine (4 - 2024-25 season) 09/15/2023 (Originally 11/12/2022)   Zoster Vaccines- Shingrix (1 of 2) 11/30/2023 (Originally 02/10/1982)   Colonoscopy  08/29/2024 (Originally 10/31/2023)   INFLUENZA VACCINE  10/12/2023   Lung Cancer Screening  06/14/2024   DTaP/Tdap/Td (2 - Td or Tdap) 05/21/2031   Pneumococcal Vaccine 93-3 Years old  Completed   Hepatitis C Screening  Completed   HIV Screening  Completed   HPV VACCINES  Aged Out   Meningococcal B Vaccine  Aged Out    Chart Review Today: I personally reviewed active problem list, medication list, allergies, family history, social history, health maintenance, notes from last encounter, lab results, imaging with the patient/caregiver today.   Review of Systems  Constitutional: Negative.   HENT: Negative.    Eyes: Negative.   Respiratory: Negative.     Cardiovascular: Negative.   Gastrointestinal: Negative.   Endocrine: Negative.   Genitourinary: Negative.   Musculoskeletal: Negative.   Skin: Negative.   Allergic/Immunologic: Negative.   Neurological: Negative.   Hematological: Negative.   Psychiatric/Behavioral: Negative.    All other systems reviewed and are negative.    Objective:   Vitals:   08/31/23 1311  BP: 122/80  Pulse: 83  Resp: 16  SpO2: 99%  Weight: 170 lb (77.1 kg)  Height: 5' 7 (1.702 m)    Body mass index is 26.63 kg/m.  Physical Exam Vitals and nursing note reviewed.  Constitutional:      General: He is not in acute distress.    Appearance: Normal appearance. He is well-developed. He is not ill-appearing, toxic-appearing or diaphoretic.  HENT:     Head: Normocephalic and atraumatic.     Right Ear: External ear normal.     Left Ear: External ear normal.     Nose: Nose normal.   Eyes:     General: No scleral icterus.       Right eye: No discharge.        Left eye: No discharge.     Conjunctiva/sclera: Conjunctivae normal.   Neck:     Trachea: No tracheal deviation.   Cardiovascular:     Rate and Rhythm: Normal rate and regular rhythm.     Pulses: Normal pulses.     Heart sounds: Normal heart sounds. No murmur heard.    No friction rub. No gallop.  Pulmonary:     Effort: Pulmonary effort is normal. No respiratory distress.     Breath sounds: No stridor.   Skin:    General: Skin is warm and dry.     Findings: No rash.   Neurological:     Mental Status: He is alert.     Motor: No abnormal muscle tone.     Coordination: Coordination normal.     Gait: Gait normal.   Psychiatric:        Mood and Affect: Mood normal.        Behavior: Behavior normal.          Assessment & Plan:   Atherosclerosis of aorta (HCC) Assessment & Plan: Prescribed statin, encouraged to take daily  Orders: -     Atorvastatin  Calcium ; Take 1 tablet (40 mg total) by mouth daily.  Dispense: 90  tablet; Refill: 3  Mixed hyperlipidemia Assessment & Plan:  on lipitor 40- pt states he has several bottles at home, he has last Rx from a year ago so likely noncompliant and labs were done a few months ago.  He reports he just restarted taking it Lab Results  Component Value Date   CHOL 181 05/30/2023   HDL 60 05/30/2023   LDLCALC 102 (H) 05/30/2023   TRIG 99 05/30/2023   CHOLHDL 3.0 05/30/2023  Encouraged him to take daily and refills ordered for him so he will have refills available for the next year, will recheck labs at his CPE scheduled 05/2024  Orders: -     Atorvastatin  Calcium ; Take 1 tablet (40 mg total) by mouth daily.  Dispense: 90 tablet; Refill: 3  Prediabetes Assessment & Plan: A1c was increasing with last several readings POC A1c done today to monitor Decreased to 5.7% Can do next labs and f/up with CPE  Orders: -     POCT glycosylated hemoglobin (Hb A1C)  Other emphysema (HCC) Assessment & Plan: Reviewed screening CT done in April he denies any sx and did not wish to consult pulm or discuss smoking cessation   Erectile dysfunction due to diseases classified elsewhere Assessment & Plan: Patient did not recall if Cialis  10 mg worked for him very well or not he does want a higher dose I prescribed 20 mg which he can break in half to try 10-20 reviewed with him concerning signs and symptoms of priapism and possible side, reviewed how meds should work for him  Orders: -     Tadalafil ; Take 1 tablet (20 mg total) by mouth daily as needed for erectile dysfunction.  Dispense: 20 tablet; Refill: 11  Current smoker Assessment & Plan: Still smoking and doing his lung cancer screening but doesn't wish to be counseled   Neck pain, chronic Assessment & Plan: Uses NSAIDs prn   Intermittent low back pain Assessment & Plan: Refilled last week his ibuprofen  he requested, he uses intermittently for aches and pains     Keep next CPE that is already scheduled for  March 2026 Encouraged him to f/up sooner if any concerns with any of his meds or dx  Adeline Hone, PA-C 08/31/23 1:37 PM

## 2023-08-31 NOTE — Patient Instructions (Addendum)
 [facility]   P: [phone] [facility] [address] [place], [place] [address] Dr. Mamie Searles for colonoscopy  Call to follow up on your nurse appt

## 2023-08-31 NOTE — Assessment & Plan Note (Signed)
 Uses NSAIDs prn

## 2023-08-31 NOTE — Assessment & Plan Note (Signed)
 Prescribed statin, encouraged to take daily

## 2023-08-31 NOTE — Assessment & Plan Note (Signed)
 Patient did not recall if Cialis  10 mg worked for him very well or not he does want a higher dose I prescribed 20 mg which he can break in half to try 10-20 reviewed with him concerning signs and symptoms of priapism and possible side, reviewed how meds should work for him

## 2023-08-31 NOTE — Assessment & Plan Note (Signed)
 Still smoking and doing his lung cancer screening but doesn't wish to be counseled

## 2023-08-31 NOTE — Assessment & Plan Note (Signed)
 Refilled last week his ibuprofen  he requested, he uses intermittently for aches and pains

## 2023-08-31 NOTE — Assessment & Plan Note (Signed)
 A1c was increasing with last several readings POC A1c done today to monitor Decreased to 5.7% Can do next labs and f/up with CPE

## 2023-08-31 NOTE — Assessment & Plan Note (Signed)
 on lipitor 40- pt states he has several bottles at home, he has last Rx from a year ago so likely noncompliant and labs were done a few months ago.  He reports he just restarted taking it Lab Results  Component Value Date   CHOL 181 05/30/2023   HDL 60 05/30/2023   LDLCALC 102 (H) 05/30/2023   TRIG 99 05/30/2023   CHOLHDL 3.0 05/30/2023  Encouraged him to take daily and refills ordered for him so he will have refills available for the next year, will recheck labs at his CPE scheduled 05/2024

## 2023-10-31 NOTE — Anesthesia Preprocedure Evaluation (Signed)
 Anesthesia Evaluation  Patient identified by MRN, date of birth, ID band Patient awake    Reviewed: Allergy & Precautions, H&P , NPO status , Patient's Chart, lab work & pertinent test results  Airway Mallampati: II  TM Distance: >3 FB Neck ROM: full    Dental no notable dental hx.    Pulmonary Current Smoker and Patient abstained from smoking. Mild centrilobular emphysema   Pulmonary exam normal        Cardiovascular negative cardio ROS Normal cardiovascular exam     Neuro/Psych negative neurological ROS  negative psych ROS   GI/Hepatic negative GI ROS, Neg liver ROS,,,  Endo/Other  negative endocrine ROS    Renal/GU negative Renal ROS  negative genitourinary   Musculoskeletal   Abdominal   Peds  Hematology negative hematology ROS (+)   Anesthesia Other Findings Past Medical History: No date: Benign enlargement of prostate No date: Elevated PSA No date: Epididymitis No date: GERD (gastroesophageal reflux disease)     Comment:  tums prn No date: Hematuria, microscopic No date: History of kidney stones     Comment:  h/o No date: History of urethral stricture No date: Testicular pain, left No date: Tobacco abuse  Past Surgical History: 11/06/2018: CYSTOSCOPY WITH URETHRAL DILATATION; N/A     Comment:  Procedure: CYSTOSCOPY WITH URETHRAL DILATATION;                Surgeon: Twylla Glendia BROCKS, MD;  Location: [facility] ORS;                Service: Urology;  Laterality: N/A; No date: NO PAST SURGERIES     Reproductive/Obstetrics negative OB ROS                              Anesthesia Physical Anesthesia Plan  ASA: 2  Anesthesia Plan: General   Post-op Pain Management:    Induction:   PONV Risk Score and Plan: Propofol  infusion and TIVA  Airway Management Planned:   Additional Equipment:   Intra-op Plan:   Post-operative Plan:   Informed Consent: I have reviewed the  patients History and Physical, chart, labs and discussed the procedure including the risks, benefits and alternatives for the proposed anesthesia with the patient or authorized representative who has indicated his/her understanding and acceptance.     Dental Advisory Given  Plan Discussed with: CRNA and Surgeon  Anesthesia Plan Comments:          Anesthesia Quick Evaluation

## 2023-11-01 ENCOUNTER — Ambulatory Visit
Admission: RE | Admit: 2023-11-01 | Discharge: 2023-11-01 | Disposition: A | Discharge status: Home / Self Care | Attending: Gastroenterology | Admitting: Gastroenterology

## 2023-11-01 ENCOUNTER — Ambulatory Visit: Admitting: Anesthesiology

## 2023-11-01 ENCOUNTER — Encounter
Admission: RE | Disposition: A | Discharge status: Home / Self Care | Payer: Self-pay | Source: Home / Self Care | Attending: Gastroenterology

## 2023-11-01 ENCOUNTER — Encounter: Admitting: Anesthesiology

## 2023-11-01 ENCOUNTER — Encounter: Payer: Self-pay | Admitting: Gastroenterology

## 2023-11-01 ENCOUNTER — Other Ambulatory Visit: Payer: Self-pay

## 2023-11-01 DIAGNOSIS — Z833 Family history of diabetes mellitus: Secondary | ICD-10-CM | POA: Diagnosis not present

## 2023-11-01 DIAGNOSIS — K633 Ulcer of intestine: Secondary | ICD-10-CM | POA: Diagnosis not present

## 2023-11-01 DIAGNOSIS — D123 Benign neoplasm of transverse colon: Secondary | ICD-10-CM | POA: Diagnosis not present

## 2023-11-01 DIAGNOSIS — Z1211 Encounter for screening for malignant neoplasm of colon: Secondary | ICD-10-CM | POA: Insufficient documentation

## 2023-11-01 DIAGNOSIS — D122 Benign neoplasm of ascending colon: Secondary | ICD-10-CM | POA: Insufficient documentation

## 2023-11-01 DIAGNOSIS — J432 Centrilobular emphysema: Secondary | ICD-10-CM | POA: Insufficient documentation

## 2023-11-01 DIAGNOSIS — F17210 Nicotine dependence, cigarettes, uncomplicated: Secondary | ICD-10-CM | POA: Insufficient documentation

## 2023-11-01 DIAGNOSIS — D124 Benign neoplasm of descending colon: Secondary | ICD-10-CM | POA: Insufficient documentation

## 2023-11-01 DIAGNOSIS — K529 Noninfective gastroenteritis and colitis, unspecified: Secondary | ICD-10-CM | POA: Diagnosis not present

## 2023-11-01 DIAGNOSIS — Z79899 Other long term (current) drug therapy: Secondary | ICD-10-CM | POA: Insufficient documentation

## 2023-11-01 HISTORY — PX: COLONOSCOPY: SHX5424

## 2023-11-01 HISTORY — PX: POLYPECTOMY: SHX149

## 2023-11-01 SURGERY — COLONOSCOPY
Anesthesia: General

## 2023-11-01 MED ORDER — GLYCOPYRROLATE 0.2 MG/ML IJ SOLN
INTRAMUSCULAR | Status: DC | PRN
  Administered 2023-11-01: .2 mg via INTRAVENOUS

## 2023-11-01 MED ORDER — SODIUM CHLORIDE 0.9 % IV SOLN: INTRAVENOUS | Status: DC

## 2023-11-01 MED ORDER — PROPOFOL 500 MG/50ML IV EMUL
INTRAVENOUS | Status: DC | PRN
  Administered 2023-11-01: 130 ug/kg/min via INTRAVENOUS

## 2023-11-01 MED ORDER — PROPOFOL 10 MG/ML IV BOLUS
INTRAVENOUS | Status: DC | PRN
  Administered 2023-11-01: 70 mg via INTRAVENOUS

## 2023-11-01 MED ORDER — LIDOCAINE HCL (CARDIAC) PF 100 MG/5ML IV SOSY
PREFILLED_SYRINGE | INTRAVENOUS | Status: DC | PRN
  Administered 2023-11-01: 25 mg via INTRAVENOUS

## 2023-11-01 NOTE — Anesthesia Postprocedure Evaluation (Signed)
 Anesthesia Post Note  Patient: Joel Clay  Procedure(s) Performed: COLONOSCOPY POLYPECTOMY, INTESTINE  Patient location during evaluation: Endoscopy Anesthesia Type: General Level of consciousness: awake and alert Pain management: pain level controlled Vital Signs Assessment: post-procedure vital signs reviewed and stable Respiratory status: spontaneous breathing, nonlabored ventilation and respiratory function stable Cardiovascular status: blood pressure returned to baseline and stable Postop Assessment: no apparent nausea or vomiting Anesthetic complications: no   No notable events documented.   Last Vitals:  Vitals:   11/01/23 0853 11/01/23 0859  BP: 95/72 100/74  Pulse: 67 62  Resp: 17 15  Temp: (!) 36 C (!) 36.1 C  SpO2: 97% 98%    Last Pain:  Vitals:   11/01/23 0859  TempSrc: Temporal  PainSc: 0-No pain                 Camellia Merilee Louder

## 2023-11-01 NOTE — H&P (Signed)
 Joel JONELLE Brooklyn, MD [facility], DHIP [address]  [place], [place] [address]  Main: [phone] Fax:  [phone] Pager: [phone]   Primary Care Physician:  Leavy Mole, PA-C Primary Gastroenterologist:  Dr. Corinn JONELLE Clay  Pre-Procedure History & Physical: HPI:  Joel Clay is a 63 y.o. male is here for an colonoscopy.   Past Medical History:  Diagnosis Date   Benign enlargement of prostate    Elevated PSA    Epididymitis    GERD (gastroesophageal reflux disease)    tums prn   Hematuria, microscopic    History of kidney stones    h/o   History of urethral stricture    Testicular pain, left    Tobacco abuse     Past Surgical History:  Procedure Laterality Date   COLONOSCOPY     CYSTOSCOPY WITH URETHRAL DILATATION N/A 11/06/2018   Procedure: CYSTOSCOPY WITH URETHRAL DILATATION;  Surgeon: Twylla Glendia BROCKS, MD;  Location: [facility] ORS;  Service: Urology;  Laterality: N/A;   NO PAST SURGERIES      Prior to Admission medications   Medication Sig Start Date End Date Taking? Authorizing Provider  atorvastatin  (LIPITOR) 40 MG tablet Take 1 tablet (40 mg total) by mouth daily. 08/31/23   Tapia, Leisa, PA-C  ibuprofen  (ADVIL ) 600 MG tablet Take 1 tablet (600 mg total) by mouth every 8 (eight) hours as needed for moderate pain (pain score 4-6) or headache. Do not use for more than 3 d in a row or more than 10 days in a month 08/24/23   Tapia, Leisa, PA-C  tadalafil  (CIALIS ) 20 MG tablet Take 1 tablet (20 mg total) by mouth daily as needed for erectile dysfunction. 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, occupational  Tobacco Use   Smoking status: Every Day    Current packs/day: 0.50    Average packs/day: 0.5 packs/day for 44.4 years (22.2 ttl pk-yrs)    Types: Cigarettes    Start date: 05/27/1979   Smokeless tobacco: Never   Tobacco comments:    referred   Vaping Use   Vaping status: Never Used  Substance and Sexual Activity   Alcohol use: Yes    Alcohol/week: 0.0 standard drinks of alcohol    Comment: occasional   Drug use: No   Sexual activity: Yes    Partners: Female  Other Topics Concern   Not on file  Social History Narrative   Works for [employer] [place] [employer] - travels regularly.   Social Drivers of Corporate investment banker Strain: Low Risk  (05/30/2023)   Overall Financial Resource Strain (CARDIA)    Difficulty of Paying Living Expenses: Not hard at all  Food Insecurity: No Food Insecurity (05/30/2023)   Hunger Vital Sign    Worried About Running Out of Food in the Last  Year: Never true    Ran Out of Food in the Last Year: Never true  Transportation Needs: No Transportation Needs (05/30/2023)   PRAPARE - Administrator, Civil Service (Medical): No    Lack of Transportation (Non-Medical): No  Physical Activity: Sufficiently Active (05/30/2023)   Exercise Vital Sign    Days of Exercise per Week: 5 days    Minutes of Exercise per Session: 60 min  Stress: No Stress Concern Present (05/30/2023)   [employer] of Occupational Health - Occupational Stress Questionnaire    Feeling of Stress : Only a little  Social Connections: Socially Isolated (05/30/2023)   Social Connection and Isolation Panel    Frequency of Communication with Friends and Family: More than three times a week    Frequency of Social Gatherings with Friends and Family: More than three times a week    Attends  Religious Services: Never    Database administrator or Organizations: No    Attends Banker Meetings: Never    Marital Status: Divorced  Catering manager Violence: Not At Risk (05/30/2023)   Humiliation, Afraid, Rape, and Kick questionnaire    Fear of Current or Ex-Partner: No    Emotionally Abused: No    Physically Abused: No    Sexually Abused: No    Review of Systems: See HPI, otherwise negative ROS  Physical Exam: BP 123/89   Pulse (!) 1   Temp (!) 96.9 F (36.1 C) (Temporal)   Resp 16   Ht 5' 7 (1.702 m)   Wt 76.3 kg   SpO2 98%   BMI 26.34 kg/m  General:   Alert,  pleasant and cooperative in NAD Head:  Normocephalic and atraumatic. Neck:  Supple; no masses or thyromegaly. Lungs:  Clear throughout to auscultation.    Heart:  Regular rate and rhythm. Abdomen:  Soft, nontender and nondistended. Normal bowel sounds, without guarding, and without rebound.   Neurologic:  Alert and  oriented x4;  grossly normal neurologically.  Impression/Plan: [name] is here for a colonoscopy to be performed for h/o colon polyp  Risks, benefits, limitations, and alternatives regarding  colonoscopy have been reviewed with the patient.  Questions have been answered.  All parties agreeable.   Joel Brooklyn, MD  11/01/2023, 8:00 AM

## 2023-11-01 NOTE — Transfer of Care (Signed)
 Immediate Anesthesia Transfer of Care Note  Patient: Joel Clay  Procedure(s) Performed: COLONOSCOPY POLYPECTOMY, INTESTINE  Patient Location: Endoscopy Unit  Anesthesia Type:General  Level of Consciousness: drowsy  Airway & Oxygen Therapy: Patient Spontanous Breathing  Post-op Assessment: Report given to RN  Post vital signs: stable  Last Vitals:  Vitals Value Taken Time  BP    Temp    Pulse    Resp    SpO2      Last Pain:  Vitals:   11/01/23 0754  TempSrc: Temporal  PainSc: 0-No pain         Complications: No notable events documented.

## 2023-11-01 NOTE — Op Note (Signed)
 [facility] Patient Name: Joel Clay Procedure Date: 11/01/2023 8:04 AM MRN: [id number] Account #: [id number] Date of Birth: Nov 30, 1960 Admit Type: Outpatient Age: 63 Room: [facility] ENDO ROOM 3 Gender: Male Note Status: Finalized Instrument Name: Colon Scope [id number] Procedure:             Colonoscopy Indications:           Surveillance: Personal history of adenomatous polyps                         on last colonoscopy > 5 years ago, Last colonoscopy:                         August 2015 Providers:             Corinn Jess Brooklyn MD, MD Referring MD:          Michelene Cower (Referring MD) Medicines:             General Anesthesia Complications:         No immediate complications. Estimated blood loss: None. Procedure:             Pre-Anesthesia Assessment:                        - Prior to the procedure, a History and Physical was                         performed, and patient medications and allergies were                         reviewed. The patient is competent. The risks and                         benefits of the procedure and the sedation options and                         risks were discussed with the patient. All questions                         were answered and informed consent was obtained.                         Patient identification and proposed procedure were                         verified by the physician, the nurse, the                         anesthesiologist, the anesthetist and the technician                         in the pre-procedure area in the procedure room in the                         endoscopy suite. Mental Status Examination: alert and                         oriented. Airway Examination: normal oropharyngeal  airway and neck mobility. Respiratory Examination:                         clear to auscultation. CV Examination: normal.                         Prophylactic Antibiotics: The patient does  not require                         prophylactic antibiotics. Prior Anticoagulants: The                         patient has taken no anticoagulant or antiplatelet                         agents. ASA Grade Assessment: II - A patient with mild                         systemic disease. After reviewing the risks and                         benefits, the patient was deemed in satisfactory                         condition to undergo the procedure. The anesthesia                         plan was to use general anesthesia. Immediately prior                         to administration of medications, the patient was                         re-assessed for adequacy to receive sedatives. The                         heart rate, respiratory rate, oxygen saturations,                         blood pressure, adequacy of pulmonary ventilation, and                         response to care were monitored throughout the                         procedure. The physical status of the patient was                         re-assessed after the procedure.                        After obtaining informed consent, the colonoscope was                         passed under direct vision. Throughout the procedure,                         the patient's blood pressure, pulse, and oxygen  saturations were monitored continuously. The                         Colonoscope was introduced through the anus and                         advanced to the the terminal ileum, with                         identification of the appendiceal orifice and IC                         valve. The colonoscopy was performed without                         difficulty. The patient tolerated the procedure well.                         The quality of the bowel preparation was evaluated                         using the BBPS [facility] Bowel Preparation Scale) with                         scores of: Right Colon = 3, Transverse Colon = 3  and                         Left Colon = 3 (entire mucosa seen well with no                         residual staining, small fragments of stool or opaque                         liquid). The total BBPS score equals 9. The terminal                         ileum, ileocecal valve, appendiceal orifice, and                         rectum were photographed. Findings:      The perianal and digital rectal examinations were normal. Pertinent       negatives include normal sphincter tone and no palpable rectal lesions.      The terminal ileum contained a few 3-5 mm ulcers. No bleeding was       present. No stigmata of recent bleeding were seen. Biopsies were taken       with a cold forceps for histology.      Three sessile polyps were found in the descending colon, transverse       colon and ascending colon. The polyps were 3 to 5 mm in size. These       polyps were removed with a cold snare. Resection and retrieval were       complete. Estimated blood loss: none.      The retroflexed view of the distal rectum and anal verge was normal and       showed no anal or rectal abnormalities. Impression:            - A few  ulcers in the terminal ileum, likley NSAID                         induced. Biopsied.                        - Three 3 to 5 mm polyps in the descending colon, in                         the transverse colon and in the ascending colon,                         removed with a cold snare. Resected and retrieved.                        - The distal rectum and anal verge are normal on                         retroflexion view. Recommendation:        - Discharge patient to home (with escort).                        - Resume previous diet today.                        - Continue present medications.                        - Await pathology results.                        - Repeat colonoscopy in 3 - 5 years for surveillance                         based on pathology results.                         - No ibuprofen , naproxen , or other non-steroidal                         anti-inflammatory drugs. Procedure Code(s):     --- Professional ---                        [phone], Colonoscopy, flexible; with removal of                         tumor(s), polyp(s), or other lesion(s) by snare                         technique                        [id number], 59, Colonoscopy, flexible; with biopsy, single                         or multiple Diagnosis Code(s):     --- Professional ---                        Z86.010, Personal history of colonic polyps  K63.3, Ulcer of intestine                        D12.4, Benign neoplasm of descending colon                        D12.3, Benign neoplasm of transverse colon (hepatic                         flexure or splenic flexure)                        D12.2, Benign neoplasm of ascending colon CPT copyright 2022 American Medical Association. All rights reserved. The codes documented in this report are preliminary and upon coder review may  be revised to meet current compliance requirements. Dr. Corinn Brooklyn Corinn Jess Brooklyn MD, MD 11/01/2023 8:38:20 AM This report has been signed electronically. Number of Addenda: 0 Note Initiated On: 11/01/2023 8:04 AM Scope Withdrawal Time: 0 hours 13 minutes 36 seconds  Total Procedure Duration: 0 hours 14 minutes 48 seconds  Estimated Blood Loss:  Estimated blood loss: none.      [facility]

## 2023-11-05 LAB — SURGICAL PATHOLOGY

## 2023-11-06 ENCOUNTER — Ambulatory Visit: Payer: Self-pay | Admitting: Gastroenterology

## 2024-05-30 ENCOUNTER — Encounter: Admitting: Family Medicine

## 2024-06-06 ENCOUNTER — Encounter: Admitting: Family Medicine
